# Patient Record
Sex: Male | Born: 1960
Health system: Southern US, Community
[De-identification: ages and names within clinical notes are randomized; demographics above are authoritative.]

## PROBLEM LIST (undated history)

## (undated) DIAGNOSIS — R519 Headache, unspecified: Secondary | ICD-10-CM

## (undated) DIAGNOSIS — K635 Polyp of colon: Secondary | ICD-10-CM

## (undated) DIAGNOSIS — L409 Psoriasis, unspecified: Secondary | ICD-10-CM

## (undated) DIAGNOSIS — R51 Headache: Secondary | ICD-10-CM

## (undated) DIAGNOSIS — T7840XA Allergy, unspecified, initial encounter: Secondary | ICD-10-CM

## (undated) HISTORY — DX: Allergy, unspecified, initial encounter: T78.40XA

## (undated) HISTORY — DX: Polyp of colon: K63.5

## (undated) HISTORY — PX: COLONOSCOPY: SHX174

## (undated) HISTORY — DX: Psoriasis, unspecified: L40.9

---

## 2003-10-26 ENCOUNTER — Other Ambulatory Visit: Payer: Self-pay

## 2005-07-14 ENCOUNTER — Ambulatory Visit: Payer: Self-pay | Admitting: Internal Medicine

## 2006-02-10 ENCOUNTER — Ambulatory Visit: Payer: Self-pay | Admitting: Internal Medicine

## 2006-09-11 ENCOUNTER — Ambulatory Visit: Payer: Self-pay | Admitting: Gastroenterology

## 2012-09-22 ENCOUNTER — Ambulatory Visit: Payer: Self-pay | Admitting: Medical

## 2015-02-13 ENCOUNTER — Ambulatory Visit: Payer: Self-pay | Admitting: Family Medicine

## 2015-09-19 ENCOUNTER — Ambulatory Visit: Payer: Self-pay

## 2015-11-09 IMAGING — CT CT ABD-PELV W/ CM
2 of 5 series · 16 of 46 positions shown, 18 images · IV contrast (omnipaque)
Comparison: None.

CLINICAL DATA: 53-year-old male with right flank pain for 6 days.
No bowel movement in 2 days. No known injury. Initial encounter.

EXAM:
CT ABDOMEN AND PELVIS WITH CONTRAST
TECHNIQUE: Multidetector CT imaging of the abdomen and pelvis was performed
using the standard protocol following bolus administration of
intravenous contrast.
CONTRAST:  100 mL Omnipaque 350.

[Series 2: routine with · axial · 0.75mm/px · z∈[-1118,-693]mm · 13 of 97 slices shown, 15 images]
[im 6/97  soft-tissue]
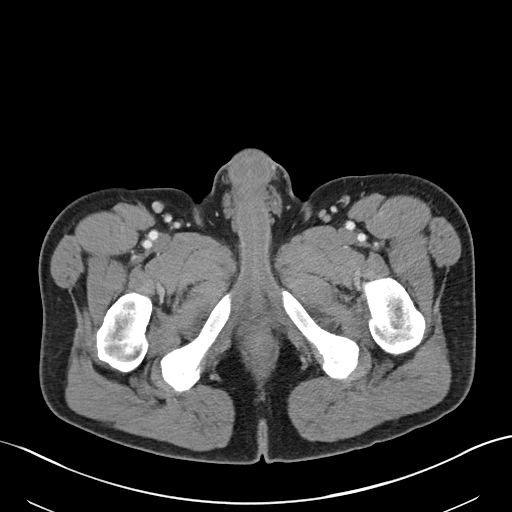
[im 6/97  bone]
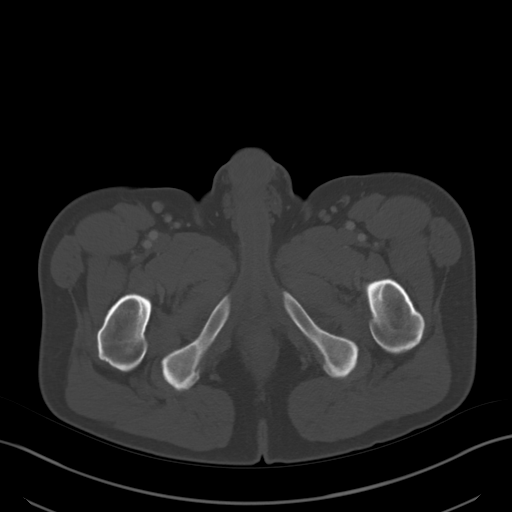
[im 11/97  soft-tissue]
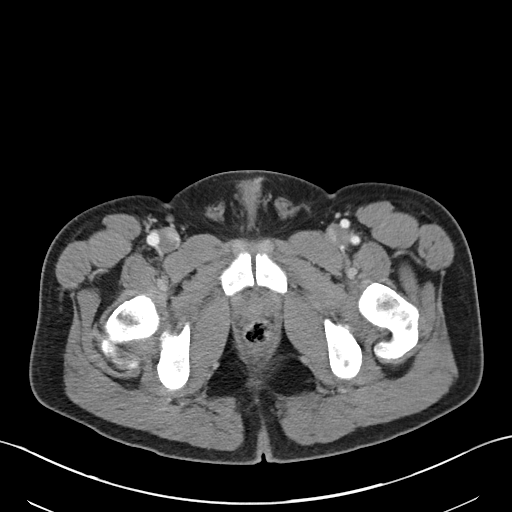
[im 22/97  soft-tissue]
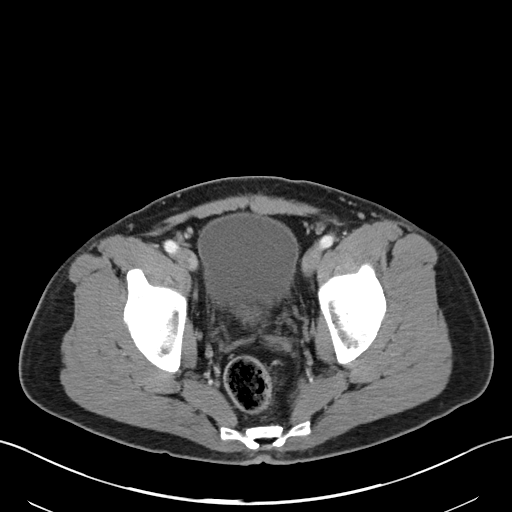
[im 27/97  soft-tissue]
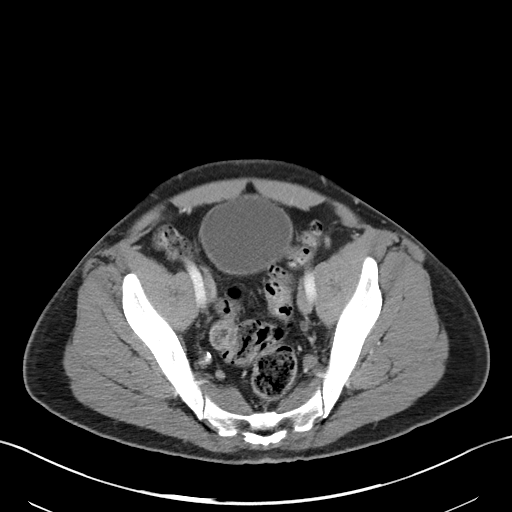
[im 33/97  soft-tissue]
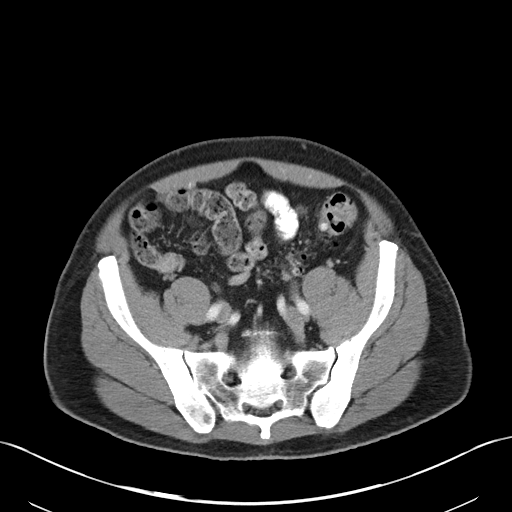
[im 43/97  soft-tissue]
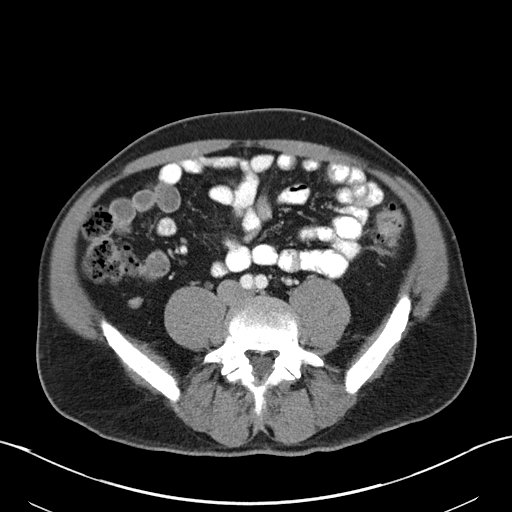
[im 49/97  soft-tissue]
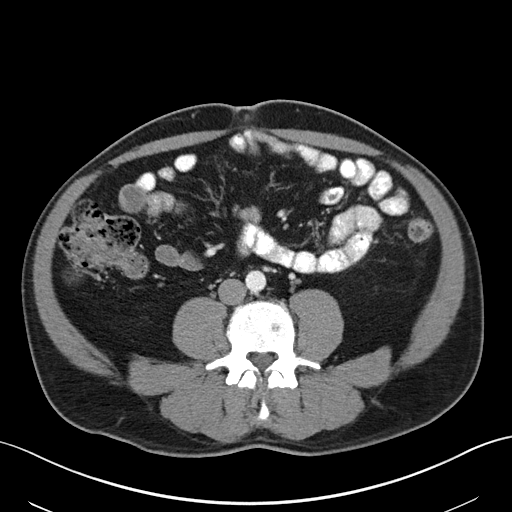
[im 54/97  soft-tissue]
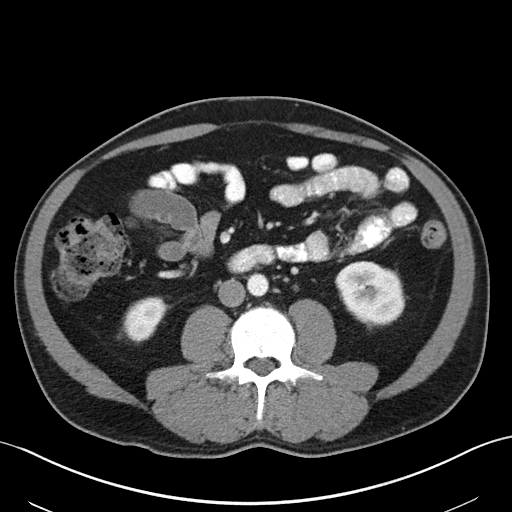
[im 65/97  soft-tissue]
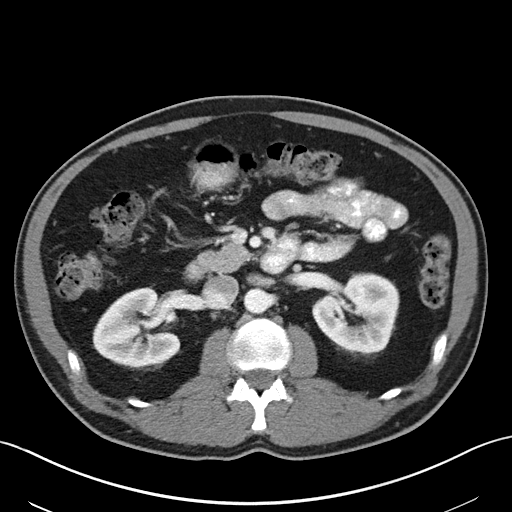
[im 65/97  bone]
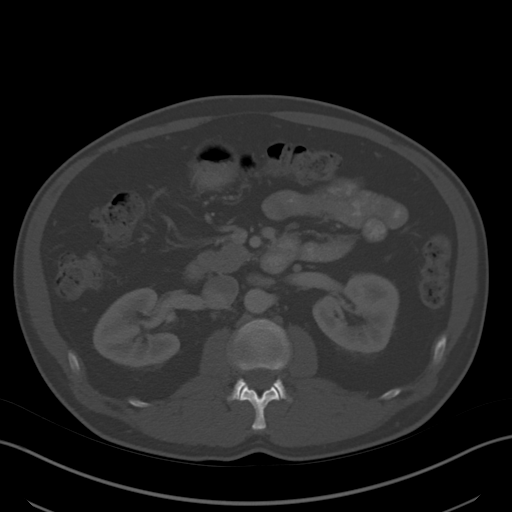
[im 70/97  soft-tissue]
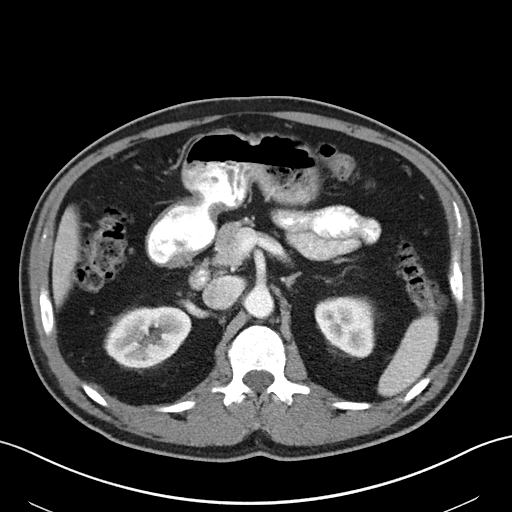
[im 75/97  soft-tissue]
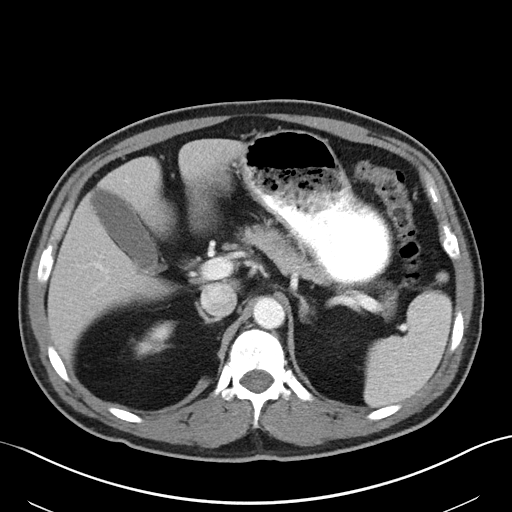
[im 86/97  soft-tissue]
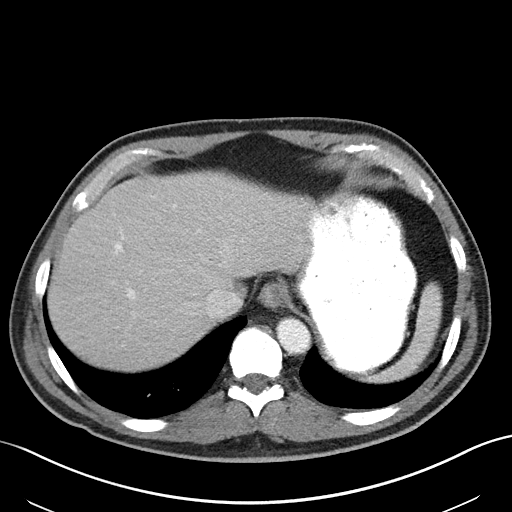
[im 91/97  soft-tissue]
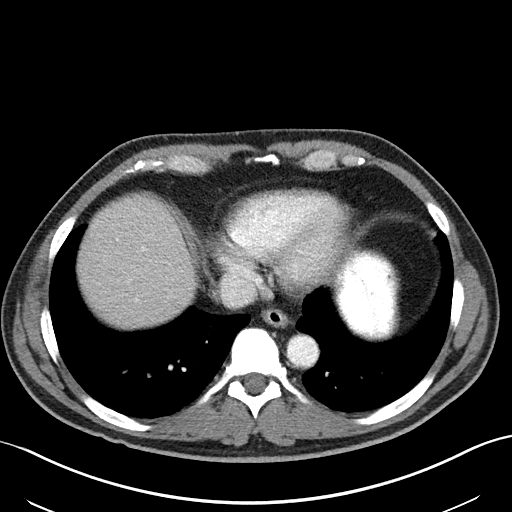

[Series 6: cor routine with · coronal · 0.75mm/px · 3 of 145 slices shown]
[im 49/145  soft-tissue]
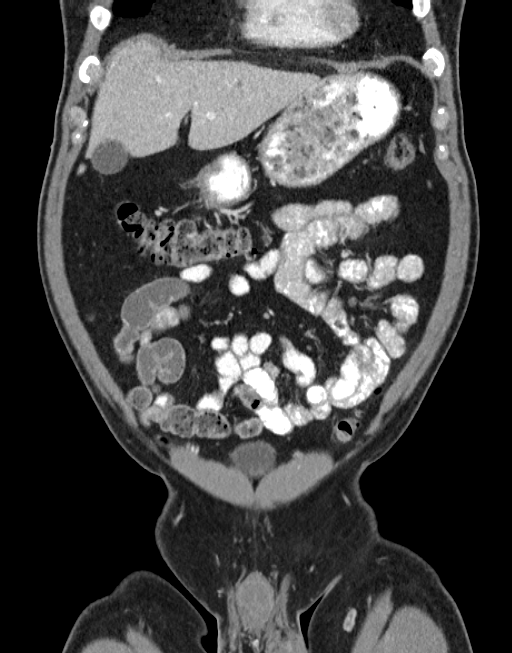
[im 65/145  soft-tissue]
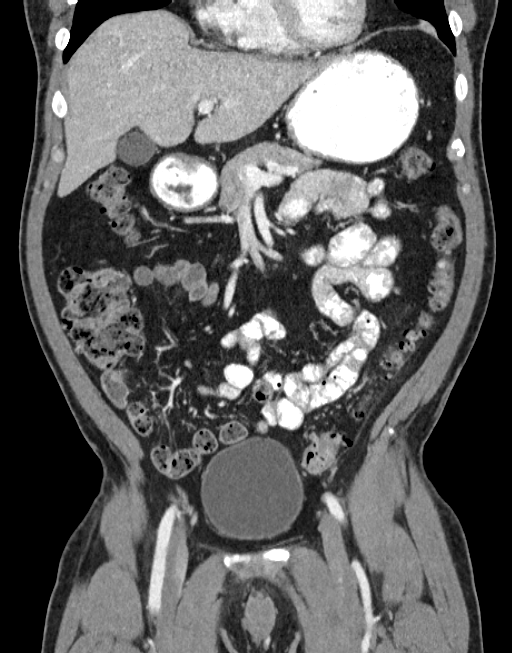
[im 81/145  soft-tissue]
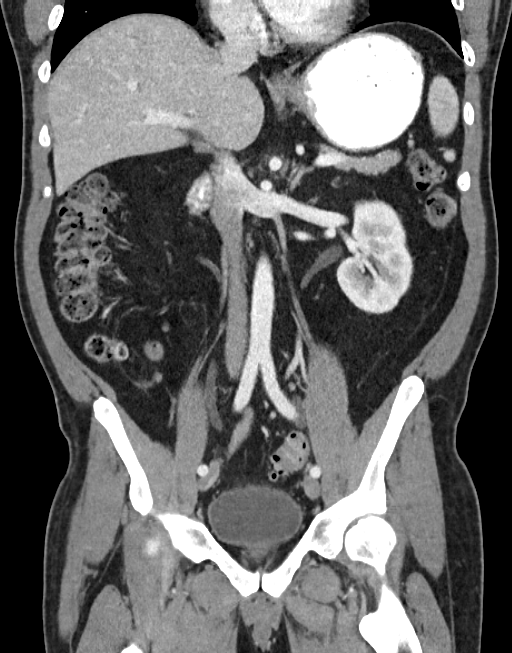

[16 of 46 positions shown; findings below may reference images not displayed]

FINDINGS: Negative lung bases.  No pericardial or pleural effusion.

Chronic L5 pars fractures. Trace L5-S1 anterolisthesis. No acute
osseous abnormality identified.

No pelvic free fluid. Retained stool in the distal colon which
otherwise appears normal. Unremarkable bladder.

Sigmoid diverticulosis. Left colon diverticulosis. There is an area
of active mesenteric inflammation at the distal descending colon,
with stranding and perhaps trace fluid in the left gutter (series 2,
image 55, sagittal image 149). No extraluminal gas. No fluid
collection.

Proximal to this diverticulosis continues but no active inflammation
is identified. The transverse colon has a more normal appearance.
There is diverticulosis in the right colon. Normal appendix. No
dilated or inflamed small bowel. There is loculated material in the
distal small bowel. Negative stomach and duodenum.

Liver, gallbladder, spleen, pancreas and adrenal glands are within
normal limits. Portal venous system within normal limits. Arterial
structures are patent. Mild Aortoiliac calcified atherosclerosis
noted. No abdominal free fluid. No lymphadenopathy. Renal
enhancement and contrast excretion is symmetric and within normal
limits.
IMPRESSION: 1. Diverticulosis of the colon. 5-6 cm segment of inflammation
affecting the distal descending colon compatible with acute
diverticulitis. No perforation or abscess.
2. Otherwise negative abdomen and pelvis.
3. Chronic L5 spondylolysis and L5-S1 grade 1 spondylolisthesis.

## 2017-01-17 ENCOUNTER — Encounter: Payer: Self-pay | Admitting: Family Medicine

## 2017-01-17 ENCOUNTER — Ambulatory Visit (INDEPENDENT_AMBULATORY_CARE_PROVIDER_SITE_OTHER): Payer: BLUE CROSS/BLUE SHIELD | Admitting: Family Medicine

## 2017-01-17 VITALS — BP 110/80 | HR 78 | Temp 98.3°F | Resp 16 | Ht 71.0 in | Wt 190.0 lb

## 2017-01-17 DIAGNOSIS — Z125 Encounter for screening for malignant neoplasm of prostate: Secondary | ICD-10-CM

## 2017-01-17 DIAGNOSIS — L409 Psoriasis, unspecified: Secondary | ICD-10-CM | POA: Diagnosis not present

## 2017-01-17 DIAGNOSIS — Z0000 Encounter for general adult medical examination without abnormal findings: Secondary | ICD-10-CM | POA: Diagnosis not present

## 2017-01-17 DIAGNOSIS — Z1211 Encounter for screening for malignant neoplasm of colon: Secondary | ICD-10-CM | POA: Diagnosis not present

## 2017-01-17 DIAGNOSIS — K5732 Diverticulitis of large intestine without perforation or abscess without bleeding: Secondary | ICD-10-CM | POA: Insufficient documentation

## 2017-01-17 DIAGNOSIS — R109 Unspecified abdominal pain: Secondary | ICD-10-CM | POA: Insufficient documentation

## 2017-01-17 DIAGNOSIS — J309 Allergic rhinitis, unspecified: Secondary | ICD-10-CM | POA: Insufficient documentation

## 2017-01-17 DIAGNOSIS — Z8614 Personal history of Methicillin resistant Staphylococcus aureus infection: Secondary | ICD-10-CM | POA: Insufficient documentation

## 2017-01-17 LAB — HEMOCCULT GUIAC POC 1CARD (OFFICE): Fecal Occult Blood, POC: NEGATIVE

## 2017-01-17 NOTE — Patient Instructions (Signed)

## 2017-01-17 NOTE — Progress Notes (Signed)
Patient: Jack Holland, Male    DOB: Aug 24, 1961, 56 y.o.   MRN: [id number] Visit Date: 01/17/2017  Today's Provider: Dortha Kern, PA   Chief Complaint  Patient presents with  . Annual Exam   Subjective:    Annual physical exam Jack Holland is a 56 y.o. male who presents today for health maintenance and complete physical. He feels well. He reports exercising none. He reports he is sleeping well. 01/08/14 CPE 01/08/14 EKG 09/11/06 Colonoscopy-normal 11/03/11 Tdap  -----------------------------------------------------------------   Review of Systems  Constitutional: Negative.   HENT: Negative.   Eyes: Negative.   Respiratory: Negative.   Gastrointestinal: Negative.   Endocrine: Negative.   Genitourinary: Negative.   Musculoskeletal: Negative.   Skin: Negative.   Allergic/Immunologic: Negative.   Neurological: Negative.   Hematological: Negative.   Psychiatric/Behavioral: Negative.     Social History      He  reports that he quit smoking about 18 years ago. He quit smokeless tobacco use about 2 weeks ago. His smokeless tobacco use included Chew. He reports that he does not drink alcohol or use drugs.       Social History   Social History  . Marital status: Married    Spouse name: N/A  . Number of children: 2  . Years of education: N/A   Social History Main Topics  . Smoking status: Former Smoker    Quit date: 12/19/1998  . Smokeless tobacco: Former Neurosurgeon    Types: Chew    Quit date: 01/03/2017  . Alcohol use No  . Drug use: No  . Sexual activity: Not Asked   Other Topics Concern  . None   Social History Narrative  . None    History reviewed. No pertinent past medical history.   Patient Active Problem List   Diagnosis Date Noted  . Abdominal pain 01/17/2017  . Allergic rhinitis 01/17/2017  . Diverticulitis of colon 01/17/2017  . History of methicillin resistant Staphylococcus aureus infection 01/17/2017    History reviewed.  No pertinent surgical history.  Family History        Family Status  Relation Status  . Mother Alive  . Father Alive  . Brother Alive  . Daughter Alive  . Son Alive        His family history includes Diabetes in his mother; Gout in his father; Hypertension in his father.     Allergies  Allergen Reactions  . Naproxen Hives    No current outpatient prescriptions on file.   Patient Care Team: Tamsen Roers, PA as PCP - General (Family Medicine)      Objective:   Vitals: BP 110/80 (BP Location: Right Arm, Patient Position: Sitting, Cuff Size: Large)   Pulse 78   Temp 98.3 F (36.8 C) (Oral)   Resp 16   Ht 5\' 11"  (1.803 m)   Wt 190 lb (86.2 kg)   SpO2 98%   BMI 26.50 kg/m    Physical Exam  Constitutional: He is oriented to person, place, and time. He appears well-developed and well-nourished.  HENT:  Head: Normocephalic and atraumatic.  Right Ear: External ear normal.  Left Ear: External ear normal.  Nose: Nose normal.  Mouth/Throat: Oropharynx is clear and moist.  Eyes: Conjunctivae and EOM are normal. Pupils are equal, round, and reactive to light. Right eye exhibits no discharge.  Neck: Normal range of motion. Neck supple. No tracheal deviation present. No thyromegaly present.  Cardiovascular: Normal rate, regular rhythm, normal  heart sounds and intact distal pulses.   No murmur heard. Pulmonary/Chest: Effort normal and breath sounds normal. No respiratory distress. He has no wheezes. He has no rales. He exhibits no tenderness.  Abdominal: Soft. Bowel sounds are normal. He exhibits no distension and no mass. There is no tenderness. There is no rebound and no guarding.  Very small umbilical hernia without discomfort - superior aspect - approximately 1 cm.  Genitourinary: Rectum normal, prostate normal and penis normal. Rectal exam shows guaiac negative stool.  Musculoskeletal: Normal range of motion. He exhibits no edema or tenderness.  Lymphadenopathy:    He  has no cervical adenopathy.  Neurological: He is alert and oriented to person, place, and time. He has normal reflexes. No cranial nerve deficit. He exhibits normal muscle tone. Coordination normal.  Skin: Skin is warm and dry. Rash noted. No erythema.  Patches of psoriasis both buttock, both posterior thighs, both inner forearms - each lesion approximately 3 cm in diameter and red with some scale.  Psychiatric: He has a normal mood and affect. His behavior is normal. Judgment and thought content normal.   Depression Screen PHQ 2/9 Scores 01/17/2017  PHQ - 2 Score 0    Assessment & Plan:     Routine Health Maintenance and Physical Exam  Exercise Activities and Dietary recommendations Goals    Encouraged slowly build to exercise for 30 minutes 4-5 days a week.      Immunization History  Administered Date(s) Administered  . Tdap 11/03/2011    Health Maintenance  Topic Date Due  . Hepatitis C Screening  September 25, 1961  . HIV Screening  07/04/1977  . TETANUS/TDAP  07/04/1981  . COLONOSCOPY  07/04/2012  . INFLUENZA VACCINE  07/19/2016     Discussed health benefits of physical activity, and encouraged him to engage in regular exercise appropriate for his age and condition.    --------------------------------------------------------------------  1. Annual physical exam General health good. Immunizations up to date. Will work on regular exercise and nutritious diet. Will get routine labs and follow up pending reports. Given anticipatory counseling. - CBC with Differential/Platelet - Comprehensive metabolic panel - Lipid panel - TSH  2. Screening for colon cancer History of mild bouts of diverticulitis over the past year. No fever, diarrhea, bloody stools or nausea. Will schedule follow up colonoscopy screening. OC-Light test negative in the office today. - Ambulatory referral to Gastroenterology  3. Psoriasis Some increase in lesions. Has 3 cm lesions on both buttocks, both  inner forearms and some on legs. May continue use of [employer] and may add Hydrocortisone prn. May need referral to dermatologist if no better. - CBC with Differential/Platelet  4. Screening PSA (prostate specific antigen) Denies dysuria, decreased urine stream, blood in urine or nocturia. No significant family history of prostate disease/cancer. Check PSA. - PSA   Dortha Kernennis Kenniya Westrich, PA  [facility]

## 2017-01-20 ENCOUNTER — Encounter: Payer: Self-pay | Admitting: Physician Assistant

## 2017-01-20 ENCOUNTER — Ambulatory Visit (INDEPENDENT_AMBULATORY_CARE_PROVIDER_SITE_OTHER): Payer: BLUE CROSS/BLUE SHIELD | Admitting: Physician Assistant

## 2017-01-20 VITALS — BP 138/82 | HR 100 | Temp 100.1°F | Resp 16 | Wt 191.0 lb

## 2017-01-20 DIAGNOSIS — R05 Cough: Secondary | ICD-10-CM | POA: Diagnosis not present

## 2017-01-20 DIAGNOSIS — R509 Fever, unspecified: Secondary | ICD-10-CM | POA: Diagnosis not present

## 2017-01-20 DIAGNOSIS — J101 Influenza due to other identified influenza virus with other respiratory manifestations: Secondary | ICD-10-CM | POA: Diagnosis not present

## 2017-01-20 DIAGNOSIS — R059 Cough, unspecified: Secondary | ICD-10-CM

## 2017-01-20 LAB — POCT INFLUENZA A/B
Influenza A, POC: POSITIVE — AB
Influenza B, POC: NEGATIVE

## 2017-01-20 MED ORDER — OSELTAMIVIR PHOSPHATE 75 MG PO CAPS: 75.0000 mg | ORAL_CAPSULE | Freq: Two times a day (BID) | ORAL | 0 refills | Status: AC

## 2017-01-20 MED ORDER — HYDROCODONE-HOMATROPINE 5-1.5 MG/5ML PO SYRP: 5.0000 mL | ORAL_SOLUTION | Freq: Every evening | ORAL | 0 refills | Status: DC | PRN

## 2017-01-20 NOTE — Patient Instructions (Signed)

## 2017-01-20 NOTE — Progress Notes (Signed)
Patient: Jack Holland Male    DOB: 12/16/1961   56 y.o.   MRN: [id number] Visit Date: 01/20/2017  Today's Provider: Trey Sailors, PA-C   Chief Complaint  Patient presents with  . URI   Subjective:    URI   This is a new problem. The current episode started in the past 7 days. The problem has been gradually worsening. The maximum temperature recorded prior to his arrival was 100.4 - 100.9 F. Associated symptoms include congestion, coughing, headaches, nausea, a sore throat and wheezing. Pertinent negatives include no abdominal pain, chest pain, diarrhea, ear pain, plugged ear sensation, rhinorrhea, sinus pain, sneezing, swollen glands or vomiting. He has tried acetaminophen and NSAIDs for the symptoms. The treatment provided no relief.       Allergies  Allergen Reactions  . Naproxen Hives    No current outpatient prescriptions on file.  Review of Systems  Constitutional: Positive for chills, diaphoresis, fatigue and fever. Negative for activity change, appetite change and unexpected weight change.  HENT: Positive for congestion and sore throat. Negative for ear pain, nosebleeds, postnasal drip, rhinorrhea, sinus pain, sinus pressure and sneezing.   Respiratory: Positive for cough, chest tightness, shortness of breath and wheezing. Negative for apnea, choking and stridor.   Cardiovascular: Negative for chest pain.  Gastrointestinal: Positive for nausea. Negative for abdominal distention, abdominal pain, anal bleeding, blood in stool, constipation, diarrhea and vomiting.  Musculoskeletal: Positive for myalgias and neck stiffness.  Neurological: Positive for light-headedness and headaches. Negative for dizziness.    Social History  Substance Use Topics  . Smoking status: Former Smoker    Quit date: 12/19/1998  . Smokeless tobacco: Former Neurosurgeon    Types: Chew    Quit date: 01/03/2017  . Alcohol use No   Objective:   BP 138/82 (BP Location: Right Arm, Patient  Position: Sitting, Cuff Size: Normal)   Pulse 100   Temp 100.1 F (37.8 C) (Oral)   Resp 16   Wt 191 lb (86.6 kg)   BMI 26.64 kg/m   Physical Exam  Constitutional: He is oriented to person, place, and time. He appears well-developed and well-nourished. He appears ill. No distress.  HENT:  Right Ear: Tympanic membrane and external ear normal.  Left Ear: Tympanic membrane and external ear normal.  Nose: Rhinorrhea present. Right sinus exhibits no maxillary sinus tenderness and no frontal sinus tenderness. Left sinus exhibits no maxillary sinus tenderness and no frontal sinus tenderness.  Mouth/Throat: Uvula is midline, oropharynx is clear and moist and mucous membranes are normal. No oropharyngeal exudate.  Clear rhinorrhea  Eyes: Conjunctivae are normal. Right eye exhibits discharge. Left eye exhibits discharge.  Watery Discharge   Neck: Normal range of motion. Neck supple.  Cardiovascular: Normal rate and regular rhythm.   Pulmonary/Chest: Effort normal and breath sounds normal. No respiratory distress. He has no wheezes. He has no rales.  Lymphadenopathy:    He has no cervical adenopathy.  Neurological: He is alert and oriented to person, place, and time.  Skin: Skin is warm and dry. He is not diaphoretic.  Psychiatric: He has a normal mood and affect. His behavior is normal.        Assessment & Plan:     1. Influenza A  - oseltamivir (TAMIFLU) 75 MG capsule; Take 1 capsule (75 mg total) by mouth 2 (two) times daily.  Dispense: 10 capsule; Refill: 0  2. Cough  - POCT Influenza A/B - HYDROcodone-homatropine (HYCODAN) 5-1.5  MG/5ML syrup; Take 5 mLs by mouth at bedtime as needed for cough.  Dispense: 120 mL; Refill: 0  3. Fever, unspecified fever cause  - POCT Influenza A/B  Return if symptoms worsen or fail to improve.  The entirety of the information documented in the History of Present Illness, Review of Systems and Physical Exam were personally obtained by me.  Portions of this information were initially documented by Kavin LeechLaura Walsh, CMA and reviewed by me for thoroughness and accuracy.    Patient Instructions  Influenza, Adult Influenza ("the flu") is an infection in the lungs, nose, and throat (respiratory tract). It is caused by a virus. The flu causes many common cold symptoms, as well as a high fever and body aches. It can make you feel very sick. The flu spreads easily from person to person (is contagious). Getting a flu shot (influenza vaccination) every year is the best way to prevent the flu. Follow these instructions at home:  Take over-the-counter and prescription medicines only as told by your doctor.  Use a cool mist humidifier to add moisture (humidity) to the air in your home. This can make it easier to breathe.  Rest as needed.  Drink enough fluid to keep your pee (urine) clear or pale yellow.  Cover your mouth and nose when you cough or sneeze.  Wash your hands with soap and water often, especially after you cough or sneeze. If you cannot use soap and water, use hand sanitizer.  Stay home from work or school as told by your doctor. Unless you are visiting your doctor, try to avoid leaving home until your fever has been gone for 24 hours without the use of medicine.  Keep all follow-up visits as told by your doctor. This is important. How is this prevented?  Getting a yearly (annual) flu shot is the best way to avoid getting the flu. You may get the flu shot in late summer, fall, or winter. Ask your doctor when you should get your flu shot.  Wash your hands often or use hand sanitizer often.  Avoid contact with people who are sick during cold and flu season.  Eat healthy foods.  Drink plenty of fluids.  Get enough sleep.  Exercise regularly. Contact a doctor if:  You get new symptoms.  You have:  Chest pain.  Watery poop (diarrhea).  A fever.  Your cough gets worse.  You start to have more mucus.  You feel  sick to your stomach (nauseous).  You throw up (vomit). Get help right away if:  You start to be short of breath or have trouble breathing.  Your skin or nails turn a bluish color.  You have very bad pain or stiffness in your neck.  You get a sudden headache.  You get sudden pain in your face or ear.  You cannot stop throwing up. This information is not intended to replace advice given to you by your health care provider. Make sure you discuss any questions you have with your health care provider. Document Released: 09/13/2008 Document Revised: 05/12/2016 Document Reviewed: 09/29/2015 Elsevier Interactive Patient Education  2017 [employer].         Trey SailorsAdriana M Pollak, PA-C  [facility]

## 2017-12-14 ENCOUNTER — Ambulatory Visit (INDEPENDENT_AMBULATORY_CARE_PROVIDER_SITE_OTHER): Payer: BLUE CROSS/BLUE SHIELD | Admitting: Physician Assistant

## 2017-12-14 DIAGNOSIS — Z23 Encounter for immunization: Secondary | ICD-10-CM | POA: Diagnosis not present

## 2017-12-14 NOTE — Progress Notes (Signed)
Flu vaccine given today without complication. Patient sat upright for 15 minutes to check for adverse reaction before being released. °

## 2018-02-22 DIAGNOSIS — L578 Other skin changes due to chronic exposure to nonionizing radiation: Secondary | ICD-10-CM | POA: Diagnosis not present

## 2018-02-22 DIAGNOSIS — D229 Melanocytic nevi, unspecified: Secondary | ICD-10-CM | POA: Diagnosis not present

## 2018-02-22 DIAGNOSIS — L821 Other seborrheic keratosis: Secondary | ICD-10-CM | POA: Diagnosis not present

## 2018-02-22 DIAGNOSIS — L409 Psoriasis, unspecified: Secondary | ICD-10-CM | POA: Diagnosis not present

## 2018-03-08 DIAGNOSIS — L419 Parapsoriasis, unspecified: Secondary | ICD-10-CM | POA: Diagnosis not present

## 2018-03-08 DIAGNOSIS — L408 Other psoriasis: Secondary | ICD-10-CM | POA: Diagnosis not present

## 2018-03-08 DIAGNOSIS — D485 Neoplasm of uncertain behavior of skin: Secondary | ICD-10-CM | POA: Diagnosis not present

## 2018-03-08 DIAGNOSIS — L308 Other specified dermatitis: Secondary | ICD-10-CM | POA: Diagnosis not present

## 2019-02-28 ENCOUNTER — Ambulatory Visit (INDEPENDENT_AMBULATORY_CARE_PROVIDER_SITE_OTHER): Payer: BLUE CROSS/BLUE SHIELD | Admitting: Physician Assistant

## 2019-02-28 ENCOUNTER — Encounter: Payer: Self-pay | Admitting: Anesthesiology

## 2019-02-28 ENCOUNTER — Telehealth: Payer: Self-pay

## 2019-02-28 ENCOUNTER — Other Ambulatory Visit: Payer: Self-pay

## 2019-02-28 ENCOUNTER — Encounter: Payer: Self-pay | Admitting: *Deleted

## 2019-02-28 ENCOUNTER — Encounter: Payer: Self-pay | Admitting: Physician Assistant

## 2019-02-28 VITALS — BP 126/84 | HR 68 | Temp 98.4°F | Resp 16 | Wt 187.2 lb

## 2019-02-28 DIAGNOSIS — Z1211 Encounter for screening for malignant neoplasm of colon: Secondary | ICD-10-CM

## 2019-02-28 DIAGNOSIS — K429 Umbilical hernia without obstruction or gangrene: Secondary | ICD-10-CM | POA: Diagnosis not present

## 2019-02-28 DIAGNOSIS — M722 Plantar fascial fibromatosis: Secondary | ICD-10-CM

## 2019-02-28 DIAGNOSIS — R29818 Other symptoms and signs involving the nervous system: Secondary | ICD-10-CM | POA: Diagnosis not present

## 2019-02-28 MED ORDER — MELOXICAM 7.5 MG PO TABS: ORAL_TABLET | ORAL | 0 refills | Status: DC

## 2019-02-28 NOTE — Patient Instructions (Signed)
Plantar Fasciitis      Plantar fasciitis is a painful foot condition that affects the heel. It occurs when the band of tissue that connects the toes to the heel bone (plantar fascia) becomes irritated. This can happen as the result of exercising too much or doing other repetitive activities (overuse injury).  The pain from plantar fasciitis can range from mild irritation to severe pain that makes it difficult to walk or move. The pain is usually worse in the morning after sleeping, or after sitting or lying down for a while. Pain may also be worse after long periods of walking or standing.  What are the causes?  This condition may be caused by:  Standing for long periods of time.  Wearing shoes that do not have good arch support.  Doing activities that put stress on joints (high-impact activities), including running, aerobics, and ballet.  Being overweight.  An abnormal way of walking (gait).  Tight muscles in the back of your lower leg (calf).  High arches in your feet.  Starting a new athletic activity.  What are the signs or symptoms?  The main symptom of this condition is heel pain. Pain may:  Be worse with first steps after a time of rest, especially in the morning after sleeping or after you have been sitting or lying down for a while.  Be worse after long periods of standing still.  Decrease after 30-45 minutes of activity, such as gentle walking.  How is this diagnosed?  This condition may be diagnosed based on your medical history and your symptoms. Your health care provider may ask questions about your activity level. Your health care provider will do a physical exam to check for:  A tender area on the bottom of your foot.  A high arch in your foot.  Pain when you move your foot.  Difficulty moving your foot.  You may have imaging tests to confirm the diagnosis, such as:  X-rays.  Ultrasound.  MRI.  How is this treated?  Treatment for plantar fasciitis depends on how severe your condition is.  Treatment may include:  Rest, ice, applying pressure (compression), and raising the affected foot (elevation). This may be called RICE therapy. Your health care provider may recommend RICE therapy along with over-the-counter pain medicines to manage your pain.  Exercises to stretch your calves and your plantar fascia.  A splint that holds your foot in a stretched, upward position while you sleep (night splint).  Physical therapy to relieve symptoms and prevent problems in the future.  Injections of steroid medicine (cortisone) to relieve pain and inflammation.  Stimulating your plantar fascia with electrical impulses (extracorporeal shock wave therapy). This is usually the last treatment option before surgery.  Surgery, if other treatments have not worked after 12 months.  Follow these instructions at home:      Managing pain, stiffness, and swelling  If directed, put ice on the painful area:  Put ice in a plastic bag, or use a frozen bottle of water.  Place a towel between your skin and the bag or bottle.  Roll the bottom of your foot over the bag or bottle.  Do this for 20 minutes, 2-3 times a day.  Wear athletic shoes that have air-sole or gel-sole cushions, or try wearing soft shoe inserts that are designed for plantar fasciitis.  Raise (elevate) your foot above the level of your heart while you are sitting or lying down.  Activity  Avoid activities that cause pain.   Ask your health care provider what activities are safe for you.  Do physical therapy exercises and stretches as told by your health care provider.  Try activities and forms of exercise that are easier on your joints (low-impact). Examples include swimming, water aerobics, and biking.  General instructions  Take over-the-counter and prescription medicines only as told by your health care provider.  Wear a night splint while sleeping, if told by your health care provider. Loosen the splint if your toes tingle, become numb, or turn cold and  blue.  Maintain a healthy weight, or work with your health care provider to lose weight as needed.  Keep all follow-up visits as told by your health care provider. This is important.  Contact a health care provider if you:  Have symptoms that do not go away after caring for yourself at home.  Have pain that gets worse.  Have pain that affects your ability to move or do your daily activities.  Summary  Plantar fasciitis is a painful foot condition that affects the heel. It occurs when the band of tissue that connects the toes to the heel bone (plantar fascia) becomes irritated.  The main symptom of this condition is heel pain that may be worse after exercising too much or standing still for a long time.  Treatment varies, but it usually starts with rest, ice, compression, and elevation (RICE therapy) and over-the-counter medicines to manage pain.  This information is not intended to replace advice given to you by your health care provider. Make sure you discuss any questions you have with your health care provider.  Document Released: 08/30/2001 Document Revised: 10/02/2017 Document Reviewed: 10/02/2017  Elsevier Interactive Patient Education © 2019 Elsevier Inc.

## 2019-02-28 NOTE — Progress Notes (Signed)
Patient: Jack Holland Male    DOB: [birth year]   58 y.o.   MRN: [id number] Visit Date: 02/28/2019  Today's Provider: Trey Sailors, PA-C   Chief Complaint  Patient presents with  . Toe Pain   Subjective:     HPI  Patient here today with Right toe pain/joint ongoing for two days. Reports that it hurts to walk. No history of gout.no redness or swelling or tender to touch. Patient took IBU this morning. No injury. He reports that the pain is worse in the morning. He reports it gets better throughout the day. He is on his feet for 10-12 hours with his job. He does wear insoles. He wears steel toed boots.   Patient also reports he is due for colonoscopy and requesting this referral today.   Patient also reports that he feels fatigued, can doze easily during the day.  Allergies  Allergen Reactions  . Naproxen Hives  . Erythromycin Rash     Current Outpatient Medications:  .  cetirizine (ZYRTEC) 10 MG tablet, Take 10 mg by mouth daily as needed for allergies., Disp: , Rfl:  .  meloxicam (MOBIC) 7.5 MG tablet, 1-2 pills daily for a week., Disp: 30 tablet, Rfl: 0 .  Multiple Vitamin (MULTIVITAMIN) tablet, Take 1 tablet by mouth daily., Disp: , Rfl:   Review of Systems  ROS negative except for what is listed in HPI.   Social History   Tobacco Use  . Smoking status: Former Smoker    Last attempt to quit: 12/19/1998    Years since quitting: 20.2  . Smokeless tobacco: Current User    Types: Chew  Substance Use Topics  . Alcohol use: No      Objective:   BP 126/84 (BP Location: Left Arm, Patient Position: Sitting, Cuff Size: Large)   Pulse 68   Temp 98.4 F (36.9 C) (Oral)   Resp 16   Wt 187 lb 3.2 oz (84.9 kg)   BMI 26.11 kg/m  Vitals:   02/28/19 1100  BP: 126/84  Pulse: 68  Resp: 16  Temp: 98.4 F (36.9 C)  TempSrc: Oral  Weight: 187 lb 3.2 oz (84.9 kg)     Physical Exam Constitutional:      Appearance: Normal appearance.  Cardiovascular:    Rate and Rhythm: Normal rate and regular rhythm.     Pulses: Normal pulses.     Heart sounds: Normal heart sounds.  Pulmonary:     Effort: Pulmonary effort is normal.     Breath sounds: Normal breath sounds.  Abdominal:     Palpations: There is mass.     Hernia: A hernia is present. Hernia is present in the umbilical area.  Musculoskeletal:     Right ankle: Normal.     Right foot: Normal.  Skin:    General: Skin is warm and dry.  Neurological:     Mental Status: He is alert and oriented to person, place, and time. Mental status is at baseline.  Psychiatric:        Mood and Affect: Mood normal.        Behavior: Behavior normal.    Results of the Epworth flowsheet 02/28/2019  Sitting and reading 2  Watching TV 2  Sitting, inactive in a public place (e.g. a theatre or a meeting) 0  As a passenger in a car for an hour without a break 0  Lying down to rest in the afternoon when circumstances permit 1  Sitting and talking to someone 0  Sitting quietly after a lunch without alcohol 1  In a car, while stopped for a few minutes in traffic 0  Total score 6        Assessment & Plan    1. Plantar fasciitis of right foot  - meloxicam (MOBIC) 7.5 MG tablet; 1-2 pills daily for a week.  Dispense: 30 tablet; Refill: 0  2. Umbilical hernia without obstruction and without gangrene  Umbilical hernia noted on exam. Counseled on strangulation precautions, avoiding abdominal exercises or heavy lifting. Refer to surgery as below.   - Ambulatory referral to General Surgery  3. Suspected sleep apnea  Home sleep study ordered, epworth complete.   4. Colon cancer screening  - Ambulatory referral to Gastroenterology  The entirety of the information documented in the History of Present Illness, Review of Systems and Physical Exam were personally obtained by me. Portions of this information were initially documented by Hetty Ely, CMA and reviewed by me for thoroughness and accuracy.          Trey Sailors, PA-C  [facility]

## 2019-02-28 NOTE — Telephone Encounter (Signed)
Gastroenterology Pre-Procedure Review  Request Date: 03/08/19 Requesting Physician: Dr. Servando Snare  PATIENT REVIEW QUESTIONS: The patient responded to the following health history questions as indicated:    1. Are you having any GI issues? No 2. Do you have a personal history of Polyps? Yes over ten years ago 3. Do you have a family history of Colon Cancer or Polyps? no 4. Diabetes Mellitus? no 5. Joint replacements in the past 12 months?no 6. Major health problems in the past 3 months?no 7. Any artificial heart valves, MVP, or defibrillator?no    MEDICATIONS & ALLERGIES:    Patient reports the following regarding taking any anticoagulation/antiplatelet therapy:   Plavix, Coumadin, Eliquis, Xarelto, Lovenox, Pradaxa, Brilinta, or Effient? no Aspirin? no  Patient confirms/reports the following medications:  Current Outpatient Medications  Medication Sig Dispense Refill  . meloxicam (MOBIC) 7.5 MG tablet 1-2 pills daily for a week. 30 tablet 0   No current facility-administered medications for this visit.     Patient confirms/reports the following allergies:  Allergies  Allergen Reactions  . Naproxen Hives    No orders of the defined types were placed in this encounter.   AUTHORIZATION INFORMATION Primary Insurance: 1D#: Group #:  Secondary Insurance: 1D#: Group #:  SCHEDULE INFORMATION: Date: 03/08/19 Time: Location:[facility]

## 2019-03-04 ENCOUNTER — Ambulatory Visit: Payer: Self-pay | Admitting: General Surgery

## 2019-03-06 ENCOUNTER — Ambulatory Visit (INDEPENDENT_AMBULATORY_CARE_PROVIDER_SITE_OTHER): Payer: BLUE CROSS/BLUE SHIELD | Admitting: General Surgery

## 2019-03-06 ENCOUNTER — Ambulatory Visit: Payer: Self-pay

## 2019-03-06 ENCOUNTER — Telehealth: Payer: Self-pay | Admitting: Family Medicine

## 2019-03-06 ENCOUNTER — Encounter: Payer: Self-pay | Admitting: *Deleted

## 2019-03-06 ENCOUNTER — Encounter: Payer: Self-pay | Admitting: General Surgery

## 2019-03-06 ENCOUNTER — Other Ambulatory Visit: Payer: Self-pay

## 2019-03-06 VITALS — BP 139/96 | HR 80 | Resp 16 | Ht 71.0 in | Wt 188.0 lb

## 2019-03-06 DIAGNOSIS — K429 Umbilical hernia without obstruction or gangrene: Secondary | ICD-10-CM | POA: Diagnosis not present

## 2019-03-06 DIAGNOSIS — E041 Nontoxic single thyroid nodule: Secondary | ICD-10-CM

## 2019-03-06 NOTE — Progress Notes (Signed)
Patient needs to be scheduled for elective umbilical hernia repair.   The patient was provided with a copy of phone interview paperwork.   Patient to be contacted once COVID-19 restrictions are lifted at [facility] to arrange a date for surgery. The patient verbalizes understanding.

## 2019-03-06 NOTE — Progress Notes (Signed)
Patient ID: Jack Holland, male   DOB: Aug 12, 1961, 58 y.o.   MRN: [id number]  Chief Complaint  Patient presents with  . New Patient (Initial Visit)    new patient Umbilical hernia without obstruction and without gangrene    HPI Jack Holland is a 58 y.o. male.   He has been referred today for surgical evaluation of an umbilical hernia.  Mr. Cieslik states that it has been present for little over a year.  It occasionally bothers him, but not to any great extent.  He says that he "knows it is there" when he coughs or strains or otherwise performs a Valsalva-type maneuver.  He denies any nausea or vomiting.  He has not had any issues with moving his bowels.  The area has never become red, hot, or tight.  He says that it has never really "poked out".  He has never had any prior surgeries.  He does admit to chewing tobacco use, but does not smoke.  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   Past Medical History:  Diagnosis Date  . Allergy   . Colon polyp   . Headache    sinus  . Psoriasis     Past Surgical History:  Procedure Laterality Date  . COLONOSCOPY  2010?    Family History  Problem Relation Age of Onset  . Diabetes Mother   . Gout Father   . Hypertension Father     Social History Social History   Tobacco Use  . Smoking status: Former Smoker    Last attempt to quit: 12/19/1998    Years since quitting: 20.2  . Smokeless tobacco: Current User    Types: Chew  Substance Use Topics  . Alcohol use: No  . Drug use: No    Allergies  Allergen Reactions  . Erythromycin Rash  . Naproxen Hives    Current Outpatient Medications  Medication Sig Dispense Refill  . cetirizine (ZYRTEC) 10 MG tablet Take 10 mg by mouth daily as needed for allergies.    . clobetasol ointment (TEMOVATE) 0.05 % Apply 2 times a day until improved and then as needed    . Multiple Vitamin (MULTIVITAMIN) tablet Take 1 tablet by mouth daily.      No current facility-administered medications for this visit.     Review of Systems Review of Systems  All other systems reviewed and are negative.   Blood pressure (!) 139/96, pulse 80, resp. rate 16, height 5\' 11"  (1.803 m), weight 188 lb (85.3 kg), SpO2 97 %.  Physical Exam Physical Exam Vitals signs reviewed.  Constitutional:      General: He is not in acute distress.    Appearance: Normal appearance. He is normal weight.  HENT:     Head: Normocephalic and atraumatic.     Nose: No congestion.     Mouth/Throat:     Pharynx: No oropharyngeal exudate or posterior oropharyngeal erythema.  Eyes:     General: No scleral icterus.       Right eye: No discharge.        Left eye: No discharge.     Conjunctiva/sclera: Conjunctivae normal.     Pupils: Pupils are equal, round, and reactive to light.  Neck:     Musculoskeletal: Normal range of motion.  Cardiovascular:     Rate and Rhythm: Normal rate and regular rhythm.     Pulses: Normal pulses.  Pulmonary:     Effort:  Pulmonary effort is normal.     Breath sounds: Normal breath sounds.  Abdominal:     General: Abdomen is flat. Bowel sounds are normal.     Palpations: Abdomen is soft.     Hernia: A hernia is present.     Comments: Tiny reducible umbilical hernia.  Diastasis rectus present.  Genitourinary:    Comments: deferred Musculoskeletal: Normal range of motion.        General: No swelling or tenderness.  Lymphadenopathy:     Cervical: No cervical adenopathy.  Skin:    General: Skin is warm and dry.  Neurological:     General: No focal deficit present.     Mental Status: He is alert and oriented to person, place, and time.  Psychiatric:        Mood and Affect: Mood normal.        Behavior: Behavior normal.        Thought Content: Thought content normal.     Data Reviewed No relevant data available for review.  Assessment This is a 58 year old man with a small, reducible umbilical hernia.  It is minimally  symptomatic at this time.  He is concerned, that with his occupation, the hernia may enlarge and he wishes to have it repaired before that can occur.  Plan Due to all elective surgeries being postponed secondary to the coronavirus pandemic, I have advised Mr. Gladin that we can repair his hernia at a future time, when we are able to proceed with elective operations.  I discussed the risks with him, including bleeding, infection, recurrence, bowel injury, and the risks of anesthesia.  As the hernia is small at this time, I do not think mesh will be required, but did let him know that there are risks associated with mesh placement, including chronic pain, seroma formation, and mesh infection.  I also discussed with him that after the operation, he will need to refrain from lifting anything heavier than 10 pounds for 6 weeks.  He will need to coordinate this with his work.  He is also at higher risk of recurrence due to nicotine use and he was advised to engage in tobacco cessation prior to his operation.  We will contact him regarding scheduling when we have a better idea of what our options will be for elective procedures in the future.      Duanne Guess 03/06/2019, 4:08 PM

## 2019-03-06 NOTE — Telephone Encounter (Signed)
Order for home sleep study faxed to [facility]

## 2019-03-06 NOTE — Patient Instructions (Addendum)
The patient is aware to call back for any questions or new concerns. Schedule surgery Recommend no lifting over 10 pounds for 6 weeks after surgery.   Umbilical Hernia, Adult  A hernia is a bulge of tissue that pushes through an opening between muscles. An umbilical hernia happens in the abdomen, near the belly button (umbilicus). The hernia may contain tissues from the small intestine, large intestine, or fatty tissue covering the intestines (omentum). Umbilical hernias in adults tend to get worse over time, and they require surgical treatment. There are several types of umbilical hernias. You may have:  A hernia located just above or below the umbilicus (indirect hernia). This is the most common type of umbilical hernia in adults.  A hernia that forms through an opening formed by the umbilicus (direct hernia).  A hernia that comes and goes (reducible hernia). A reducible hernia may be visible only when you strain, lift something heavy, or cough. This type of hernia can be pushed back into the abdomen (reduced).  A hernia that traps abdominal tissue inside the hernia (incarcerated hernia). This type of hernia cannot be reduced.  A hernia that cuts off blood flow to the tissues inside the hernia (strangulated hernia). The tissues can start to die if this happens. This type of hernia requires emergency treatment. What are the causes? An umbilical hernia happens when tissue inside the abdomen presses on a weak area of the abdominal muscles. What increases the risk? You may have a greater risk of this condition if you:  Are obese.  Have had several pregnancies.  Have a buildup of fluid inside your abdomen (ascites).  Have had surgery that weakens the abdominal muscles. What are the signs or symptoms? The main symptom of this condition is a painless bulge at or near the belly button. A reducible hernia may be visible only when you strain, lift something heavy, or cough. Other symptoms may  include:  Dull pain.  A feeling of pressure. Symptoms of a strangulated hernia may include:  Pain that gets increasingly worse.  Nausea and vomiting.  Pain when pressing on the hernia.  Skin over the hernia becoming red or purple.  Constipation.  Blood in the stool. How is this diagnosed? This condition may be diagnosed based on:  A physical exam. You may be asked to cough or strain while standing. These actions increase the pressure inside your abdomen and force the hernia through the opening in your muscles. Your health care provider may try to reduce the hernia by pressing on it.  Your symptoms and medical history. How is this treated? Surgery is the only treatment for an umbilical hernia. Surgery for a strangulated hernia is done as soon as possible. If you have a small hernia that is not incarcerated, you may need to lose weight before having surgery. Follow these instructions at home:  Lose weight, if told by your health care provider.  Do not try to push the hernia back in.  Watch your hernia for any changes in color or size. Tell your health care provider if any changes occur.  You may need to avoid activities that increase pressure on your hernia.  Do not lift anything that is heavier than 10 lb (4.5 kg) until your health care provider says that this is safe.  Take over-the-counter and prescription medicines only as told by your health care provider.  Keep all follow-up visits as told by your health care provider. This is important. Contact a health care provider  if:  Your hernia gets larger.  Your hernia becomes painful. Get help right away if:  You develop sudden, severe pain near the area of your hernia.  You have pain as well as nausea or vomiting.  You have pain and the skin over your hernia changes color.  You develop a fever. This information is not intended to replace advice given to you by your health care provider. Make sure you discuss any  questions you have with your health care provider. Document Released: 05/06/2016 Document Revised: 01/17/2018 Document Reviewed: 06/05/2017 Elsevier Interactive Patient Education  2019 [employer].

## 2019-03-08 ENCOUNTER — Ambulatory Visit
Admission: RE | Admit: 2019-03-08 | Payer: BLUE CROSS/BLUE SHIELD | Source: Home / Self Care | Admitting: Gastroenterology

## 2019-03-08 HISTORY — DX: Headache: R51

## 2019-03-08 HISTORY — DX: Headache, unspecified: R51.9

## 2019-03-08 SURGERY — COLONOSCOPY WITH PROPOFOL
Anesthesia: Choice

## 2019-03-22 DIAGNOSIS — R0602 Shortness of breath: Secondary | ICD-10-CM | POA: Diagnosis not present

## 2019-03-22 DIAGNOSIS — G4733 Obstructive sleep apnea (adult) (pediatric): Secondary | ICD-10-CM | POA: Diagnosis not present

## 2019-03-23 DIAGNOSIS — G4733 Obstructive sleep apnea (adult) (pediatric): Secondary | ICD-10-CM | POA: Diagnosis not present

## 2019-03-23 DIAGNOSIS — R0602 Shortness of breath: Secondary | ICD-10-CM | POA: Diagnosis not present

## 2019-04-26 DIAGNOSIS — G4733 Obstructive sleep apnea (adult) (pediatric): Secondary | ICD-10-CM | POA: Diagnosis not present

## 2019-04-30 ENCOUNTER — Telehealth: Payer: Self-pay | Admitting: Physician Assistant

## 2019-04-30 NOTE — Telephone Encounter (Signed)
Order for CPAP signed and placed for fax.

## 2019-05-23 ENCOUNTER — Telehealth: Payer: Self-pay | Admitting: Gastroenterology

## 2019-05-23 NOTE — Telephone Encounter (Signed)
Pt   Wife is calling to r/s pt procedure please return her call to schedule

## 2019-05-27 DIAGNOSIS — G4733 Obstructive sleep apnea (adult) (pediatric): Secondary | ICD-10-CM | POA: Diagnosis not present

## 2019-05-27 NOTE — Telephone Encounter (Signed)
Returned patients wife's call to schedule her husband's colonoscopy.  She did not have her calendar with her and said she will call back once she gets back to her desk.  Scheduling note: Screening Colon with Dr. Allen Norris  Thanks Diana Armijo

## 2019-05-29 ENCOUNTER — Telehealth: Payer: Self-pay | Admitting: Gastroenterology

## 2019-05-29 ENCOUNTER — Other Ambulatory Visit: Payer: Self-pay

## 2019-05-29 DIAGNOSIS — Z1211 Encounter for screening for malignant neoplasm of colon: Secondary | ICD-10-CM

## 2019-05-29 MED ORDER — NA SULFATE-K SULFATE-MG SULF 17.5-3.13-1.6 GM/177ML PO SOLN: 1.0000 | Freq: Once | ORAL | 0 refills | Status: AC

## 2019-05-29 NOTE — Telephone Encounter (Signed)
Pt  Wife is calling back to reschedule pt procedure she wants to check if June 29th is available

## 2019-06-13 ENCOUNTER — Encounter: Payer: Self-pay | Admitting: *Deleted

## 2019-06-13 ENCOUNTER — Other Ambulatory Visit: Payer: Self-pay

## 2019-06-13 ENCOUNTER — Other Ambulatory Visit
Admission: RE | Admit: 2019-06-13 | Discharge: 2019-06-13 | Disposition: A | Discharge status: Home / Self Care | Payer: BC Managed Care – PPO | Source: Ambulatory Visit | Attending: Gastroenterology | Admitting: Gastroenterology

## 2019-06-13 DIAGNOSIS — Z1159 Encounter for screening for other viral diseases: Secondary | ICD-10-CM | POA: Insufficient documentation

## 2019-06-13 DIAGNOSIS — Z1211 Encounter for screening for malignant neoplasm of colon: Secondary | ICD-10-CM

## 2019-06-14 LAB — NOVEL CORONAVIRUS, NAA (HOSP ORDER, SEND-OUT TO REF LAB; TAT 18-24 HRS): SARS-CoV-2, NAA: NOT DETECTED

## 2019-06-14 NOTE — Discharge Instructions (Signed)
General Anesthesia, Adult, Care After  This sheet gives you information about how to care for yourself after your procedure. Your health care provider may also give you more specific instructions. If you have problems or questions, contact your health care provider.  What can I expect after the procedure?  After the procedure, the following side effects are common:  Pain or discomfort at the IV site.  Nausea.  Vomiting.  Sore throat.  Trouble concentrating.  Feeling cold or chills.  Weak or tired.  Sleepiness and fatigue.  Soreness and body aches. These side effects can affect parts of the body that were not involved in surgery.  Follow these instructions at home:    For at least 24 hours after the procedure:  Have a responsible adult stay with you. It is important to have someone help care for you until you are awake and alert.  Rest as needed.  Do not:  Participate in activities in which you could fall or become injured.  Drive.  Use heavy machinery.  Drink alcohol.  Take sleeping pills or medicines that cause drowsiness.  Make important decisions or sign legal documents.  Take care of children on your own.  Eating and drinking  Follow any instructions from your health care provider about eating or drinking restrictions.  When you feel hungry, start by eating small amounts of foods that are soft and easy to digest (bland), such as toast. Gradually return to your regular diet.  Drink enough fluid to keep your urine pale yellow.  If you vomit, rehydrate by drinking water, juice, or clear broth.  General instructions  If you have sleep apnea, surgery and certain medicines can increase your risk for breathing problems. Follow instructions from your health care provider about wearing your sleep device:  Anytime you are sleeping, including during daytime naps.  While taking prescription pain medicines, sleeping medicines, or medicines that make you drowsy.  Return to your normal activities as told by your health care  provider. Ask your health care provider what activities are safe for you.  Take over-the-counter and prescription medicines only as told by your health care provider.  If you smoke, do not smoke without supervision.  Keep all follow-up visits as told by your health care provider. This is important.  Contact a health care provider if:  You have nausea or vomiting that does not get better with medicine.  You cannot eat or drink without vomiting.  You have pain that does not get better with medicine.  You are unable to pass urine.  You develop a skin rash.  You have a fever.  You have redness around your IV site that gets worse.  Get help right away if:  You have difficulty breathing.  You have chest pain.  You have blood in your urine or stool, or you vomit blood.  Summary  After the procedure, it is common to have a sore throat or nausea. It is also common to feel tired.  Have a responsible adult stay with you for the first 24 hours after general anesthesia. It is important to have someone help care for you until you are awake and alert.  When you feel hungry, start by eating small amounts of foods that are soft and easy to digest (bland), such as toast. Gradually return to your regular diet.  Drink enough fluid to keep your urine pale yellow.  Return to your normal activities as told by your health care provider. Ask your health care   provider what activities are safe for you.  This information is not intended to replace advice given to you by your health care provider. Make sure you discuss any questions you have with your health care provider.  Document Released: 03/13/2001 Document Revised: 07/21/2017 Document Reviewed: 07/21/2017  Elsevier Interactive Patient Education  2019 [employer] Inc[employer]

## 2019-06-17 ENCOUNTER — Ambulatory Visit: Payer: BC Managed Care – PPO | Admitting: Anesthesiology

## 2019-06-17 ENCOUNTER — Encounter
Admission: RE | Disposition: A | Discharge status: Home / Self Care | Payer: Self-pay | Source: Home / Self Care | Attending: Gastroenterology

## 2019-06-17 ENCOUNTER — Ambulatory Visit
Admission: RE | Admit: 2019-06-17 | Discharge: 2019-06-17 | Disposition: A | Discharge status: Home / Self Care | Payer: BC Managed Care – PPO | Attending: Gastroenterology | Admitting: Gastroenterology

## 2019-06-17 DIAGNOSIS — Z1211 Encounter for screening for malignant neoplasm of colon: Secondary | ICD-10-CM | POA: Diagnosis not present

## 2019-06-17 DIAGNOSIS — Z8719 Personal history of other diseases of the digestive system: Secondary | ICD-10-CM | POA: Diagnosis not present

## 2019-06-17 DIAGNOSIS — K5730 Diverticulosis of large intestine without perforation or abscess without bleeding: Secondary | ICD-10-CM | POA: Diagnosis not present

## 2019-06-17 DIAGNOSIS — K641 Second degree hemorrhoids: Secondary | ICD-10-CM | POA: Insufficient documentation

## 2019-06-17 DIAGNOSIS — Z87891 Personal history of nicotine dependence: Secondary | ICD-10-CM | POA: Insufficient documentation

## 2019-06-17 DIAGNOSIS — L409 Psoriasis, unspecified: Secondary | ICD-10-CM | POA: Insufficient documentation

## 2019-06-17 HISTORY — PX: COLONOSCOPY WITH PROPOFOL: SHX5780

## 2019-06-17 SURGERY — COLONOSCOPY WITH PROPOFOL
Anesthesia: General

## 2019-06-17 MED ORDER — PROPOFOL 10 MG/ML IV BOLUS
INTRAVENOUS | Status: DC | PRN
  Administered 2019-06-17 (×2): 40 mg via INTRAVENOUS
  Administered 2019-06-17: 150 mg via INTRAVENOUS
  Administered 2019-06-17 (×2): 40 mg via INTRAVENOUS

## 2019-06-17 MED ORDER — SODIUM CHLORIDE 0.9 % IV SOLN: INTRAVENOUS | Status: DC

## 2019-06-17 MED ORDER — LACTATED RINGERS IV SOLN
INTRAVENOUS | Status: DC
  Administered 2019-06-17: 10:00:00 via INTRAVENOUS

## 2019-06-17 MED ORDER — ONDANSETRON HCL 4 MG/2ML IJ SOLN: 4.0000 mg | Freq: Once | INTRAMUSCULAR | Status: DC | PRN

## 2019-06-17 MED ORDER — STERILE WATER FOR IRRIGATION IR SOLN
Status: DC | PRN
  Administered 2019-06-17: .3 mL

## 2019-06-17 MED ORDER — LIDOCAINE HCL (CARDIAC) PF 100 MG/5ML IV SOSY
PREFILLED_SYRINGE | INTRAVENOUS | Status: DC | PRN
  Administered 2019-06-17: 30 mg via INTRAVENOUS

## 2019-06-17 SURGICAL SUPPLY — 24 items
CANISTER SUCT 1200ML W/VALVE (MISCELLANEOUS) ×2 IMPLANT
CLIP HMST 235XBRD CATH ROT (MISCELLANEOUS) IMPLANT
CLIP RESOLUTION 360 11X235 (MISCELLANEOUS)
ELECT REM PT RETURN 9FT ADLT (ELECTROSURGICAL)
ELECTRODE REM PT RTRN 9FT ADLT (ELECTROSURGICAL) IMPLANT
FCP ESCP3.2XJMB 240X2.8X (MISCELLANEOUS)
FORCEPS BIOP RAD 4 LRG CAP 4 (CUTTING FORCEPS) IMPLANT
FORCEPS BIOP RJ4 240 W/NDL (MISCELLANEOUS)
FORCEPS ESCP3.2XJMB 240X2.8X (MISCELLANEOUS) IMPLANT
GOWN CVR UNV OPN BCK APRN NK (MISCELLANEOUS) ×2 IMPLANT
GOWN ISOL THUMB LOOP REG UNIV (MISCELLANEOUS) ×4
INJECTOR VARIJECT VIN23 (MISCELLANEOUS) IMPLANT
KIT DEFENDO VALVE AND CONN (KITS) IMPLANT
KIT ENDO PROCEDURE OLY (KITS) ×2 IMPLANT
MARKER SPOT ENDO TATTOO 5ML (MISCELLANEOUS) IMPLANT
PROBE APC STR FIRE (PROBE) IMPLANT
RETRIEVER NET ROTH 2.5X230 LF (MISCELLANEOUS) IMPLANT
SNARE SHORT THROW 13M SML OVAL (MISCELLANEOUS) IMPLANT
SNARE SHORT THROW 30M LRG OVAL (MISCELLANEOUS) IMPLANT
SNARE SNG USE RND 15MM (INSTRUMENTS) IMPLANT
SPOT EX ENDOSCOPIC TATTOO (MISCELLANEOUS)
TRAP ETRAP POLY (MISCELLANEOUS) IMPLANT
VARIJECT INJECTOR VIN23 (MISCELLANEOUS)
WATER STERILE IRR 250ML POUR (IV SOLUTION) ×2 IMPLANT

## 2019-06-17 NOTE — Anesthesia Procedure Notes (Signed)
Date/Time: 06/17/2019 9:49 AM Performed by: Cameron Ali, CRNA Pre-anesthesia Checklist: Patient identified, Emergency Drugs available, Suction available, Timeout performed and Patient being monitored Patient Re-evaluated:Patient Re-evaluated prior to induction Oxygen Delivery Method: Nasal cannula Placement Confirmation: positive ETCO2

## 2019-06-17 NOTE — Anesthesia Postprocedure Evaluation (Signed)
Anesthesia Post Note  Patient: Jack Holland  Procedure(s) Performed: COLONOSCOPY WITH PROPOFOL (N/A )  Patient location during evaluation: PACU Anesthesia Type: General Level of consciousness: awake Pain management: pain level controlled Vital Signs Assessment: post-procedure vital signs reviewed and stable Respiratory status: respiratory function stable Cardiovascular status: stable Postop Assessment: no signs of nausea or vomiting Anesthetic complications: no    Veda Canning

## 2019-06-17 NOTE — Anesthesia Preprocedure Evaluation (Signed)
Anesthesia Evaluation  Patient identified by MRN, date of birth, ID band Patient awake    Reviewed: Allergy & Precautions, NPO status , Patient's Chart, lab work & pertinent test results  Airway Mallampati: II  TM Distance: >3 FB     Dental   Pulmonary former smoker,    breath sounds clear to auscultation       Cardiovascular negative cardio ROS   Rhythm:Regular Rate:Normal     Neuro/Psych  Headaches,    GI/Hepatic Hx diverticulitis and polyps   Endo/Other    Renal/GU      Musculoskeletal   Abdominal   Peds  Hematology   Anesthesia Other Findings Psoriasis  Reproductive/Obstetrics                             Anesthesia Physical Anesthesia Plan  ASA: II  Anesthesia Plan: General   Post-op Pain Management:    Induction: Intravenous  PONV Risk Score and Plan:   Airway Management Planned: Nasal Cannula and Natural Airway  Additional Equipment:   Intra-op Plan:   Post-operative Plan:   Informed Consent: I have reviewed the patients History and Physical, chart, labs and discussed the procedure including the risks, benefits and alternatives for the proposed anesthesia with the patient or authorized representative who has indicated his/her understanding and acceptance.       Plan Discussed with: CRNA  Anesthesia Plan Comments:         Anesthesia Quick Evaluation

## 2019-06-17 NOTE — H&P (Signed)
[name], MD [facility] [address], [place], [place] [address] Phone: [phone] Fax : [phone]  Primary Care Physician:  Margo Common, [place] Primary Gastroenterologist:  Dr. Allen Norris  Pre-Procedure History & Physical: HPI:  Jack Holland is a 58 y.o. male is here for a screening colonoscopy.   Past Medical History:  Diagnosis Date  . Allergy   . Colon polyp   . Headache    sinus  . Psoriasis     Past Surgical History:  Procedure Laterality Date  . COLONOSCOPY  2010?    Prior to Admission medications   Medication Sig Start Date End Date Taking? Authorizing Provider  clobetasol ointment (TEMOVATE) 0.05 % Apply 2 times a day until improved and then as needed 03/08/18  Yes [provider]  cetirizine (ZYRTEC) 10 MG tablet Take 10 mg by mouth daily as needed for allergies.    [provider]  Multiple Vitamin (MULTIVITAMIN) tablet Take 1 tablet by mouth daily.    [provider]    Allergies as of 06/13/2019 - Review Complete 06/13/2019  Allergen Reaction Noted  . Erythromycin Rash 02/28/2019  . Naproxen Hives     Family History  Problem Relation Age of Onset  . Diabetes Mother   . Gout Father   . Hypertension Father     Social History   Socioeconomic History  . Marital status: Married    Spouse name: Not on file  . Number of children: 2  . Years of education: Not on file  . Highest education level: Not on file  Occupational History  . Not on file  Social Needs  . Financial resource strain: Not on file  . Food insecurity    Worry: Not on file    Inability: Not on file  . Transportation needs    Medical: Not on file    Non-medical: Not on file  Tobacco Use  . Smoking status: Former Smoker    Quit date: 12/19/1998    Years since quitting: 20.5  . Smokeless tobacco: Current User    Types: Chew  Substance and Sexual Activity  . Alcohol use: No  . Drug use: No  . Sexual activity: Not on file  Lifestyle  .  Physical activity    Days per week: Not on file    Minutes per session: Not on file  . Stress: Not on file  Relationships  . Social Herbalist on phone: Not on file    Gets together: Not on file    Attends religious service: Not on file    Active member of club or organization: Not on file    Attends meetings of clubs or organizations: Not on file    Relationship status: Not on file  . Intimate partner violence    Fear of current or ex partner: Not on file    Emotionally abused: Not on file    Physically abused: Not on file    Forced sexual activity: Not on file  Other Topics Concern  . Not on file  Social History Narrative  . Not on file    Review of Systems: See HPI, otherwise negative ROS  Physical Exam: BP (!) 135/99   Pulse 71   Temp (!) 97.5 F (36.4 C) (Temporal)   Ht 5\' 11"  (1.803 m)   Wt 82.1 kg   SpO2 99%   BMI 25.24 kg/m  General:   Alert,  pleasant and cooperative in NAD Head:  Normocephalic and atraumatic. Neck:  Supple; no masses or thyromegaly. Lungs:  Clear throughout to auscultation.    Heart:  Regular rate and rhythm. Abdomen:  Soft, nontender and nondistended. Normal bowel sounds, without guarding, and without rebound.   Neurologic:  Alert and  oriented x4;  grossly normal neurologically.  Impression/Plan: Jack Holland is now here to undergo a screening colonoscopy.  Risks, benefits, and alternatives regarding colonoscopy have been reviewed with the patient.  Questions have been answered.  All parties agreeable.

## 2019-06-17 NOTE — Op Note (Signed)
[facility] Gastroenterology Patient Name: Jack Holland Procedure Date: 06/17/2019 9:43 AM MRN: [id number] Account #: [id number] Date of Birth: 09-Aug-1961 Admit Type: Outpatient Age: [age] [facility] Gender: Male Note Status: Finalized Procedure:            Colonoscopy Indications:          Screening for colorectal malignant neoplasm Providers:            Midge Miniumarren Andrya Roppolo MD, MD Referring MD:         Jodell Ciproennis E. Chrismon, MD (Referring MD) Medicines:            Propofol per Anesthesia Complications:        No immediate complications. Procedure:            Pre-Anesthesia Assessment:                       - Prior to the procedure, a History and Physical was                        performed, and patient medications and allergies were                        reviewed. The patient's tolerance of previous                        anesthesia was also reviewed. The risks and benefits of                        the procedure and the sedation options and risks were                        discussed with the patient. All questions were                        answered, and informed consent was obtained. Prior                        Anticoagulants: The patient has taken no previous                        anticoagulant or antiplatelet agents. ASA Grade                        Assessment: II - A patient with mild systemic disease.                        After reviewing the risks and benefits, the patient was                        deemed in satisfactory condition to undergo the                        procedure.                       After obtaining informed consent, the colonoscope was                        passed under direct vision. Throughout the procedure,  the patient's blood pressure, pulse, and oxygen                        saturations were monitored continuously. The was                        introduced through the anus and advanced to the the                 cecum, identified by appendiceal orifice and ileocecal                        valve. The colonoscopy was performed without                        difficulty. The patient tolerated the procedure well.                        The quality of the bowel preparation was excellent. Findings:      The perianal and digital rectal examinations were normal.      Multiple small-mouthed diverticula were found in the entire colon.      Non-bleeding internal hemorrhoids were found during retroflexion. The       hemorrhoids were Grade II (internal hemorrhoids that prolapse but reduce       spontaneously). Impression:           - Diverticulosis in the entire examined colon.                       - Non-bleeding internal hemorrhoids.                       - No specimens collected. Recommendation:       - Discharge patient to home.                       - Resume previous diet.                       - Continue present medications.                       - Repeat colonoscopy in 10 years for screening unless                        any change in family history or lower GI problems. Procedure Code(s):    --- Professional ---                       [id number], Colonoscopy, flexible; diagnostic, including                        collection of specimen(s) by brushing or washing, when                        performed (separate procedure) Diagnosis Code(s):    --- Professional ---                       Z12.11, Encounter for screening for malignant neoplasm                        of colon CPT copyright 2019 American  Medical Association. All rights reserved. The codes documented in this report are preliminary and upon coder review may  be revised to meet current compliance requirements. [name] MD, MD 06/17/2019 10:07:53 AM This report has been signed electronically. Number of Addenda: 0 Note Initiated On: 06/17/2019 9:43 AM Scope Withdrawal Time: 0 hours 9 minutes 24 seconds  Total Procedure Duration: 0  hours 11 minutes 24 seconds  Estimated Blood Loss: Estimated blood loss: none.      [facility]

## 2019-06-17 NOTE — Transfer of Care (Signed)
Immediate Anesthesia Transfer of Care Note  Patient: Jack Holland  Procedure(s) Performed: COLONOSCOPY WITH PROPOFOL (N/A )  Patient Location: PACU  Anesthesia Type: General  Level of Consciousness: awake, alert  and patient cooperative  Airway and Oxygen Therapy: Patient Spontanous Breathing and Patient connected to supplemental oxygen  Post-op Assessment: Post-op Vital signs reviewed, Patient's Cardiovascular Status Stable, Respiratory Function Stable, Patent Airway and No signs of Nausea or vomiting  Post-op Vital Signs: Reviewed and stable  Complications: No apparent anesthesia complications

## 2019-06-18 ENCOUNTER — Encounter: Payer: Self-pay | Admitting: Gastroenterology

## 2019-06-26 DIAGNOSIS — G4733 Obstructive sleep apnea (adult) (pediatric): Secondary | ICD-10-CM | POA: Diagnosis not present

## 2019-07-03 ENCOUNTER — Telehealth: Payer: Self-pay | Admitting: *Deleted

## 2019-07-03 NOTE — Telephone Encounter (Signed)
Spoke with the patient today about getting umbilical hernia surgery rescheduled that was previously postponed due to COVID-19.  He will need to refrain from lifting anything heavier than 10 pounds for 6 weeks after surgery per Dr. Celine Ahr. The patient has also been encouraged to stop smoking prior to surgery.   The patient wishes to check with his employer and call the office back to arrange due to the above.   Patient will need a pre-op visit with Dr. Celine Ahr.

## 2019-07-27 DIAGNOSIS — G4733 Obstructive sleep apnea (adult) (pediatric): Secondary | ICD-10-CM | POA: Diagnosis not present

## 2019-07-30 ENCOUNTER — Telehealth: Payer: Self-pay | Admitting: General Surgery

## 2019-07-30 NOTE — Telephone Encounter (Signed)
I have called patient to discuss rescheduling surgery-umbilical hernia repair. I went over patient's deductible-patient's insurance plan starts over Jan 2021. Patient did want to make an appointment in October to see Dr Celine Ahr to discuss a possible surgery date in November. Patient is scheduled to see Dr Celine Ahr on October 6th.

## 2019-08-21 ENCOUNTER — Telehealth (INDEPENDENT_AMBULATORY_CARE_PROVIDER_SITE_OTHER): Payer: BLUE CROSS/BLUE SHIELD | Admitting: Physician Assistant

## 2019-08-21 DIAGNOSIS — R059 Cough, unspecified: Secondary | ICD-10-CM

## 2019-08-21 DIAGNOSIS — R05 Cough: Secondary | ICD-10-CM

## 2019-08-21 NOTE — Progress Notes (Signed)
   Subjective:    Patient ID: Jack Holland, male    DOB: [birth year], 58 y.o.   MRN: [id number]  Jack Holland is a 58 y.o. male presenting on 08/21/2019 for Cough  Virtual Visit via Video Note  I connected with Jack Holland on 08/21/19 at  2:40 PM EDT by a video enabled telemedicine application and verified that I am speaking with the correct person using two identifiers.   I discussed the limitations of evaluation and management by telemedicine and the availability of in person appointments. The patient expressed understanding and agreed to proceed.  Patient location: home Provider location: [place] office  Persons involved in the visit: patient, provider   Interactive audio and video communications were attempted, although failed due to patient's inability to connect to video. Continued visit with audio only interaction with patient agreement.  HPI   Patient is a 57 y/o man presenting with cough, congestion, and tightness ongoing for 2 days. Denies fevers, chills, nausea, vomiting. Reports he is screened daily when going to work. No asthma, smoking. Does have some fall allergies.    Social History   Tobacco Use  . Smoking status: Former Smoker    Quit date: 12/19/1998    Years since quitting: 20.6  . Smokeless tobacco: Current User    Types: Chew  Substance Use Topics  . Alcohol use: No  . Drug use: No    Review of Systems Per HPI unless specifically indicated above     Objective:    There were no vitals taken for this visit.  Wt Readings from Last 3 Encounters:  06/17/19 181 lb (82.1 kg)  03/06/19 188 lb (85.3 kg)  02/28/19 187 lb 3.2 oz (84.9 kg)    Physical Exam Pulmonary:     Effort: Pulmonary effort is normal.     Comments: Patient speaking in complete sentences without pause.    Results for orders placed or performed during the hospital encounter of 06/13/19  Novel Coronavirus, NAA (hospital order; send-out to ref lab)     Specimen: Nasopharyngeal Swab; Respiratory  Result Value Ref Range   SARS-CoV-2, NAA NOT DETECTED NOT DETECTED   Coronavirus Source NASOPHARYNGEAL       Assessment & Plan:   Problem List Items Addressed This Visit    None    Visit Diagnoses    Cough    -  Primary   Relevant Orders   Novel Coronavirus, NAA ([facility])      No orders of the defined types were placed in this encounter.     Follow up plan: Return if symptoms worsen or fail to improve.  Carles Collet, PA-C [facility] 08/21/2019, 2:51 PM

## 2019-08-22 ENCOUNTER — Other Ambulatory Visit: Payer: Self-pay

## 2019-08-22 DIAGNOSIS — R6889 Other general symptoms and signs: Secondary | ICD-10-CM | POA: Diagnosis not present

## 2019-08-22 DIAGNOSIS — Z20822 Contact with and (suspected) exposure to covid-19: Secondary | ICD-10-CM

## 2019-08-23 LAB — NOVEL CORONAVIRUS, NAA: SARS-CoV-2, NAA: NOT DETECTED

## 2019-08-27 DIAGNOSIS — G4733 Obstructive sleep apnea (adult) (pediatric): Secondary | ICD-10-CM | POA: Diagnosis not present

## 2019-09-24 ENCOUNTER — Other Ambulatory Visit: Payer: Self-pay

## 2019-09-24 ENCOUNTER — Encounter: Payer: Self-pay | Admitting: General Surgery

## 2019-09-24 ENCOUNTER — Ambulatory Visit (INDEPENDENT_AMBULATORY_CARE_PROVIDER_SITE_OTHER): Payer: BC Managed Care – PPO | Admitting: General Surgery

## 2019-09-24 VITALS — BP 124/85 | HR 77 | Temp 97.7°F | Ht 71.0 in | Wt 186.8 lb

## 2019-09-24 DIAGNOSIS — K429 Umbilical hernia without obstruction or gangrene: Secondary | ICD-10-CM

## 2019-09-24 NOTE — H&P (View-Only) (Signed)
Patient ID: Jack Holland, male   DOB: Feb 10, 1961, 58 y.o.   MRN: [id number]  Chief Complaint  Patient presents with  . Umbilical Hernia    discuss surgery     HPI Jack Holland is a 58 y.o. male.   I initially saw him in March of this year.  My initial history of present illness is copied here:  "He has been referred today for surgical evaluation of an umbilical hernia.  Mr. [name] states that it has been present for little over a year.  It occasionally bothers him, but not to any great extent.  He says that he "knows it is there" when he coughs or strains or otherwise performs a Valsalva-type maneuver.  He denies any nausea or vomiting.  He has not had any issues with moving his bowels.  The area has never become red, hot, or tight.  He says that it has never really "poked out".  He has never had any prior surgeries.  He does admit to chewing tobacco use, but does not smoke.  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  He is here today to discuss operative intervention.  Since her last visit, he has not noticed any change in the hernia.  He has not had any new symptoms worsening of his prior symptoms.  He is accompanied by his wife today.   Past Medical History:  Diagnosis Date  . Allergy   . Colon polyp   . Headache    sinus  . Psoriasis     Past Surgical History:  Procedure Laterality Date  . COLONOSCOPY  2010?  . COLONOSCOPY WITH PROPOFOL N/A 06/17/2019   Procedure: COLONOSCOPY WITH PROPOFOL;  Surgeon: [name], MD;  Location: [place];  Service: Endoscopy;  Laterality: N/A;    Family History  Problem Relation Age of Onset  . Diabetes Mother   . Gout Father   . Hypertension Father     Social History Social History   Tobacco Use  . Smoking status: Former Smoker    Quit date: 12/19/1998    Years  since quitting: 20.7  . Smokeless tobacco: Current User    Types: Chew  Substance Use Topics  . Alcohol use: No  . Drug use: No    Allergies  Allergen Reactions  . Erythromycin Rash  . Naproxen Hives    Current Outpatient Medications  Medication Sig Dispense Refill  . cetirizine (ZYRTEC) 10 MG tablet Take 10 mg by mouth daily as needed for allergies.    . clobetasol ointment (TEMOVATE) 0.05 % Apply 2 times a day until improved and then as needed    . Multiple Vitamin (MULTIVITAMIN) tablet Take 1 tablet by mouth daily.     No current facility-administered medications for this visit.     Review of Systems Review of Systems  All other systems reviewed and are negative.   Blood pressure 124/85, pulse 77, temperature 97.7 F (36.5 C), temperature source Temporal, height 5\' 11"  (1.803 m), weight 186 lb 12.8 oz (84.7 kg), SpO2 97 %. Body mass index is 26.05 kg/m.  Physical Exam Physical Exam Constitutional:      General: He is not in acute distress.    Appearance: Normal appearance. He is normal weight.  HENT:     Head: Normocephalic and atraumatic.     Nose:  Comments: Covered with a mask secondary to COVID-19 precautions    Mouth/Throat:     Comments: Covered with a mask secondary to COVID-19 precautions Eyes:     General: No scleral icterus.       Right eye: No discharge.        Left eye: No discharge.     Comments: Wearing glasses  Neck:     Musculoskeletal: Normal range of motion.     Comments: No thyromegaly or dominant thyroid masses are appreciated. Cardiovascular:     Rate and Rhythm: Normal rate and regular rhythm.     Pulses: Normal pulses.  Pulmonary:     Effort: Pulmonary effort is normal.     Breath sounds: Normal breath sounds.  Abdominal:     General: Abdomen is flat. Bowel sounds are normal.     Palpations: Abdomen is soft.     Hernia: A hernia is present.     Comments: There is a small, easily reducible umbilical hernia present.   Genitourinary:    Comments: Deferred Musculoskeletal: Normal range of motion.        General: No swelling.  Lymphadenopathy:     Cervical: No cervical adenopathy.  Skin:    Comments: Psoriasis lesions present.  Neurological:     General: No focal deficit present.     Mental Status: He is alert and oriented to person, place, and time.  Psychiatric:        Mood and Affect: Mood normal.        Behavior: Behavior normal.     Data Reviewed There are no new data to review  Assessment This is a 58-year-old man with a small umbilical hernia.  His symptoms are currently mild.  He is interested in proceeding with surgical repair to prevent further progression of the defect.  Plan We will schedule him for an open umbilical hernia repair.  I feel that the defect is small enough, that it will likely only require primary suture repair, but I did talk to him about the potential use of mesh.  The risks of the operation were discussed.  These include, but are not limited to, bleeding, infection, injury to bowel or other structures, need for mesh, mesh complications (pain, infection, shrinkage, displacement, among others), recurrence.  He and his wife asked a number of questions, all of which were answered to their satisfaction.  We will proceed with getting him scheduled for an operation.    [name] 09/24/2019, 2:27 PM   

## 2019-09-24 NOTE — Progress Notes (Signed)
Patient ID: Jerold Yoss, male   DOB: Feb 10, 1961, 58 y.o.   MRN: [id number]  Chief Complaint  Patient presents with  . Umbilical Hernia    discuss surgery     HPI [name] is a 58 y.o. male.   I initially saw him in March of this year.  My initial history of present illness is copied here:  "He has been referred today for surgical evaluation of an umbilical hernia.  Mr. [name] states that it has been present for little over a year.  It occasionally bothers him, but not to any great extent.  He says that he "knows it is there" when he coughs or strains or otherwise performs a Valsalva-type maneuver.  He denies any nausea or vomiting.  He has not had any issues with moving his bowels.  The area has never become red, hot, or tight.  He says that it has never really "poked out".  He has never had any prior surgeries.  He does admit to chewing tobacco use, but does not smoke.  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  He is here today to discuss operative intervention.  Since her last visit, he has not noticed any change in the hernia.  He has not had any new symptoms worsening of his prior symptoms.  He is accompanied by his wife today.   Past Medical History:  Diagnosis Date  . Allergy   . Colon polyp   . Headache    sinus  . Psoriasis     Past Surgical History:  Procedure Laterality Date  . COLONOSCOPY  2010?  . COLONOSCOPY WITH PROPOFOL N/A 06/17/2019   Procedure: COLONOSCOPY WITH PROPOFOL;  Surgeon: [name], MD;  Location: [place];  Service: Endoscopy;  Laterality: N/A;    Family History  Problem Relation Age of Onset  . Diabetes Mother   . Gout Father   . Hypertension Father     Social History Social History   Tobacco Use  . Smoking status: Former Smoker    Quit date: 12/19/1998    Years  since quitting: 20.7  . Smokeless tobacco: Current User    Types: Chew  Substance Use Topics  . Alcohol use: No  . Drug use: No    Allergies  Allergen Reactions  . Erythromycin Rash  . Naproxen Hives    Current Outpatient Medications  Medication Sig Dispense Refill  . cetirizine (ZYRTEC) 10 MG tablet Take 10 mg by mouth daily as needed for allergies.    . clobetasol ointment (TEMOVATE) 0.05 % Apply 2 times a day until improved and then as needed    . Multiple Vitamin (MULTIVITAMIN) tablet Take 1 tablet by mouth daily.     No current facility-administered medications for this visit.     Review of Systems Review of Systems  All other systems reviewed and are negative.   Blood pressure 124/85, pulse 77, temperature 97.7 F (36.5 C), temperature source Temporal, height 5\' 11"  (1.803 m), weight 186 lb 12.8 oz (84.7 kg), SpO2 97 %. Body mass index is 26.05 kg/m.  Physical Exam Physical Exam Constitutional:      General: He is not in acute distress.    Appearance: Normal appearance. He is normal weight.  HENT:     Head: Normocephalic and atraumatic.     Nose:  Comments: Covered with a mask secondary to COVID-19 precautions    Mouth/Throat:     Comments: Covered with a mask secondary to COVID-19 precautions Eyes:     General: No scleral icterus.       Right eye: No discharge.        Left eye: No discharge.     Comments: Wearing glasses  Neck:     Musculoskeletal: Normal range of motion.     Comments: No thyromegaly or dominant thyroid masses are appreciated. Cardiovascular:     Rate and Rhythm: Normal rate and regular rhythm.     Pulses: Normal pulses.  Pulmonary:     Effort: Pulmonary effort is normal.     Breath sounds: Normal breath sounds.  Abdominal:     General: Abdomen is flat. Bowel sounds are normal.     Palpations: Abdomen is soft.     Hernia: A hernia is present.     Comments: There is a small, easily reducible umbilical hernia present.   Genitourinary:    Comments: Deferred Musculoskeletal: Normal range of motion.        General: No swelling.  Lymphadenopathy:     Cervical: No cervical adenopathy.  Skin:    Comments: Psoriasis lesions present.  Neurological:     General: No focal deficit present.     Mental Status: He is alert and oriented to person, place, and time.  Psychiatric:        Mood and Affect: Mood normal.        Behavior: Behavior normal.     Data Reviewed There are no new data to review  Assessment This is a 58 year old man with a small umbilical hernia.  His symptoms are currently mild.  He is interested in proceeding with surgical repair to prevent further progression of the defect.  Plan We will schedule him for an open umbilical hernia repair.  I feel that the defect is small enough, that it will likely only require primary suture repair, but I did talk to him about the potential use of mesh.  The risks of the operation were discussed.  These include, but are not limited to, bleeding, infection, injury to bowel or other structures, need for mesh, mesh complications (pain, infection, shrinkage, displacement, among others), recurrence.  He and his wife asked a number of questions, all of which were answered to their satisfaction.  We will proceed with getting him scheduled for an operation.    Duanne Guess 09/24/2019, 2:27 PM

## 2019-09-24 NOTE — Patient Instructions (Signed)
You have requested for your Umbilical Hernia be repaired. This will be scheduled with Dr. Celine Ahr at [facility].  Please see your (blue)pre-care sheet for information.  You will need to arrange to be off work for 1-2 weeks but will have to have a lifting restriction of no more than 15 lbs for 6 weeks following your surgery.   Umbilical Hernia, Adult A hernia is a bulge of tissue that pushes through an opening between muscles. An umbilical hernia happens in the abdomen, near the belly button (umbilicus). The hernia may contain tissues from the small intestine, large intestine, or fatty tissue covering the intestines (omentum). Umbilical hernias in adults tend to get worse over time, and they require surgical treatment. There are several types of umbilical hernias. You may have:  A hernia located just above or below the umbilicus (indirect hernia). This is the most common type of umbilical hernia in adults.  A hernia that forms through an opening formed by the umbilicus (direct hernia).  A hernia that comes and goes (reducible hernia). A reducible hernia may be visible only when you strain, lift something heavy, or cough. This type of hernia can be pushed back into the abdomen (reduced).  A hernia that traps abdominal tissue inside the hernia (incarcerated hernia). This type of hernia cannot be reduced.  A hernia that cuts off blood flow to the tissues inside the hernia (strangulated hernia). The tissues can start to die if this happens. This type of hernia requires emergency treatment.  What are the causes? An umbilical hernia happens when tissue inside the abdomen presses on a weak area of the abdominal muscles. What increases the risk? You may have a greater risk of this condition if you:  Are obese.  Have had several pregnancies.  Have a buildup of fluid inside your abdomen (ascites).  Have had surgery that weakens the abdominal muscles.  What are the signs or symptoms? The  main symptom of this condition is a painless bulge at or near the belly button. A reducible hernia may be visible only when you strain, lift something heavy, or cough. Other symptoms may include:  Dull pain.  A feeling of pressure.  Symptoms of a strangulated hernia may include:  Pain that gets increasingly worse.  Nausea and vomiting.  Pain when pressing on the hernia.  Skin over the hernia becoming red or purple.  Constipation.  Blood in the stool.  How is this diagnosed? This condition may be diagnosed based on:  A physical exam. You may be asked to cough or strain while standing. These actions increase the pressure inside your abdomen and force the hernia through the opening in your muscles. Your health care provider may try to reduce the hernia by pressing on it.  Your symptoms and medical history.  How is this treated? Surgery is the only treatment for an umbilical hernia. Surgery for a strangulated hernia is done as soon as possible. If you have a small hernia that is not incarcerated, you may need to lose weight before having surgery. Follow these instructions at home:  Lose weight, if told by your health care provider.  Do not try to push the hernia back in.  Watch your hernia for any changes in color or size. Tell your health care provider if any changes occur.  You may need to avoid activities that increase pressure on your hernia.  Do not lift anything that is heavier than 10 lb (4.5 kg) until your health care provider says  that this is safe.  Take over-the-counter and prescription medicines only as told by your health care provider.  Keep all follow-up visits as told by your health care provider. This is important. Contact a health care provider if:  Your hernia gets larger.  Your hernia becomes painful. Get help right away if:  You develop sudden, severe pain near the area of your hernia.  You have pain as well as nausea or vomiting.  You have pain  and the skin over your hernia changes color.  You develop a fever. This information is not intended to replace advice given to you by your health care provider. Make sure you discuss any questions you have with your health care provider. Document Released: 05/06/2016 Document Revised: 08/07/2016 Document Reviewed: 05/06/2016 Elsevier Interactive Patient Education  [employer].

## 2019-09-25 ENCOUNTER — Telehealth: Payer: Self-pay | Admitting: General Surgery

## 2019-09-25 NOTE — Telephone Encounter (Signed)
Pt has been advised of pre admission date/time, Covid Testing date and Surgery date.  Surgery Date: 10/14/19 with Dr Cher Nakai umbilical hernia repair. Preadmission Testing Date: 10/08/19 between 8-1:00pm-phone interview.  Covid Testing Date: 10/10/19 between 8-10:30am - patient advised to go to the [place] [facility]  [employer] Video sent via [employer] and [employer].  Patient has been made aware to call [phone], between 1-3:00pm the friday before surgery, to find out what time to arrive.

## 2019-09-26 DIAGNOSIS — G4733 Obstructive sleep apnea (adult) (pediatric): Secondary | ICD-10-CM | POA: Diagnosis not present

## 2019-10-08 ENCOUNTER — Other Ambulatory Visit: Payer: Self-pay

## 2019-10-08 ENCOUNTER — Encounter
Admission: RE | Admit: 2019-10-08 | Discharge: 2019-10-08 | Disposition: A | Discharge status: Home / Self Care | Payer: BC Managed Care – PPO | Source: Ambulatory Visit | Attending: General Surgery | Admitting: General Surgery

## 2019-10-08 NOTE — Patient Instructions (Addendum)
Your procedure is scheduled on: 10/14/2019 Mon Report to Same Day Surgery 2nd floor medical mall [facility] Entrance-take elevator on left to 2nd floor.  Check in with surgery information desk.) To find out your arrival time please call [phone] between 1PM - 3PM on 10/11/2019 Fri  Remember: Instructions that are not followed completely may result in serious medical risk, up to and including death, or upon the discretion of your surgeon and anesthesiologist your surgery may need to be rescheduled.    _x___ 1. Do not eat food after midnight the night before your procedure. You may drink clear liquids up to 2 hours before you are scheduled to arrive at the hospital for your procedure.  Do not drink clear liquids within 2 hours of your scheduled arrival to the hospital.  Clear liquids include  --Water or Apple juice without pulp  --Clear carbohydrate beverage such as ClearFast or Gatorade  --Black Coffee or Clear Tea (No milk, no creamers, do not add anything to                  the coffee or Tea Type 1 and type 2 diabetics should only drink water.   ____Ensure clear carbohydrate drink on the way to the hospital for bariatric patients  ____Ensure clear carbohydrate drink 3 hours before surgery.   No gum chewing or hard candies.     __x__ 2. No Alcohol for 24 hours before or after surgery.   __x__3. No Smoking or e-cigarettes for 24 prior to surgery.  Do not use any chewable tobacco products for at least 6 hour prior to surgery   ____  4. Bring all medications with you on the day of surgery if instructed.    __x__ 5. Notify your doctor if there is any change in your medical condition     (cold, fever, infections).    x___6. On the morning of surgery brush your teeth with toothpaste and water.  You may rinse your mouth with mouth wash if you wish.  Do not swallow any toothpaste or mouthwash.   Do not wear jewelry, make-up, hairpins, clips or nail polish.  Do not wear lotions,  powders, or perfumes. You may wear deodorant.  Do not shave 48 hours prior to surgery. Men may shave face and neck.  Do not bring valuables to the hospital.    [facility] is not responsible for any belongings or valuables.               Contacts, dentures or bridgework may not be worn into surgery.  Leave your suitcase in the car. After surgery it may be brought to your room.  For patients admitted to the hospital, discharge time is determined by your                       treatment team.  _  Patients discharged the day of surgery will not be allowed to drive home.  You will need someone to drive you home and stay with you the night of your procedure.    Please read over the following fact sheets that you were given:   [facility] Preparing for Surgery and or MRSA Information   _x___ Take anti-hypertensive listed below, cardiac, seizure, asthma,     anti-reflux and psychiatric medicines. These include:  1. cetirizine (ZYRTEC) 10 MG tablet if needed  2.fluticasone (FLONASE) 50 MCG/ACT nasal spray if needed  3.  4.  5.  6.  ____Fleets  enema or Magnesium Citrate as directed.   _x___ Use CHG Soap or sage wipes as directed on instruction sheet   ____ Use inhalers on the day of surgery and bring to hospital day of surgery  ____ Stop Metformin and Janumet 2 days prior to surgery.    ____ Take 1/2 of usual insulin dose the night before surgery and none on the morning     surgery.   _x___ Follow recommendations from Cardiologist, Pulmonologist or PCP regarding          stopping Aspirin, Coumadin, Plavix ,Eliquis, Effient, or Pradaxa, and Pletal.  X____Stop Anti-inflammatories such as Advil, Aleve, Ibuprofen, Motrin, Naproxen, Naprosyn, Goodies powders or aspirin products. OK to take Tylenol and                          Celebrex.   _x___ Stop supplements until after surgery.  But may continue Vitamin D, Vitamin B,       and multivitamin.   ____ Bring C-Pap to the hospital.

## 2019-10-10 ENCOUNTER — Other Ambulatory Visit
Admission: RE | Admit: 2019-10-10 | Discharge: 2019-10-10 | Disposition: A | Discharge status: Home / Self Care | Payer: BC Managed Care – PPO | Source: Ambulatory Visit | Attending: General Surgery | Admitting: General Surgery

## 2019-10-10 ENCOUNTER — Other Ambulatory Visit: Payer: Self-pay

## 2019-10-10 DIAGNOSIS — Z01812 Encounter for preprocedural laboratory examination: Secondary | ICD-10-CM | POA: Insufficient documentation

## 2019-10-10 DIAGNOSIS — Z20828 Contact with and (suspected) exposure to other viral communicable diseases: Secondary | ICD-10-CM | POA: Diagnosis not present

## 2019-10-10 LAB — SARS CORONAVIRUS 2 (TAT 6-24 HRS): SARS Coronavirus 2: NEGATIVE

## 2019-10-13 MED ORDER — CEFAZOLIN SODIUM-DEXTROSE 2-4 GM/100ML-% IV SOLN
2.0000 g | INTRAVENOUS | Status: AC
  Administered 2019-10-14: 2 g via INTRAVENOUS

## 2019-10-14 ENCOUNTER — Ambulatory Visit: Payer: BC Managed Care – PPO | Admitting: Anesthesiology

## 2019-10-14 ENCOUNTER — Ambulatory Visit
Admission: RE | Admit: 2019-10-14 | Discharge: 2019-10-14 | Disposition: A | Discharge status: Home / Self Care | Payer: BC Managed Care – PPO | Attending: General Surgery | Admitting: General Surgery

## 2019-10-14 ENCOUNTER — Encounter: Payer: Self-pay | Admitting: *Deleted

## 2019-10-14 ENCOUNTER — Encounter
Admission: RE | Disposition: A | Discharge status: Home / Self Care | Payer: Self-pay | Source: Home / Self Care | Attending: General Surgery

## 2019-10-14 ENCOUNTER — Other Ambulatory Visit: Payer: Self-pay

## 2019-10-14 DIAGNOSIS — K429 Umbilical hernia without obstruction or gangrene: Secondary | ICD-10-CM

## 2019-10-14 DIAGNOSIS — Z87891 Personal history of nicotine dependence: Secondary | ICD-10-CM | POA: Diagnosis not present

## 2019-10-14 HISTORY — PX: UMBILICAL HERNIA REPAIR: SHX196

## 2019-10-14 SURGERY — REPAIR, HERNIA, UMBILICAL, ADULT
Anesthesia: General

## 2019-10-14 MED ORDER — ACETAMINOPHEN 500 MG PO TABS
1000.0000 mg | ORAL_TABLET | ORAL | Status: AC
  Administered 2019-10-14: 07:00:00 1000 mg via ORAL

## 2019-10-14 MED ORDER — LIDOCAINE-EPINEPHRINE 1 %-1:100000 IJ SOLN
INTRAMUSCULAR | Status: DC | PRN
  Administered 2019-10-14: 10 mL

## 2019-10-14 MED ORDER — CHLORHEXIDINE GLUCONATE CLOTH 2 % EX PADS: 6.0000 | MEDICATED_PAD | Freq: Once | CUTANEOUS | Status: DC

## 2019-10-14 MED ORDER — MIDAZOLAM HCL 2 MG/2ML IJ SOLN
INTRAMUSCULAR | Status: DC | PRN
  Administered 2019-10-14: 2 mg via INTRAVENOUS

## 2019-10-14 MED ORDER — PHENYLEPHRINE HCL (PRESSORS) 10 MG/ML IV SOLN
INTRAVENOUS | Status: AC
  Filled 2019-10-14: qty 1

## 2019-10-14 MED ORDER — ONDANSETRON HCL 4 MG/2ML IJ SOLN
INTRAMUSCULAR | Status: DC | PRN
  Administered 2019-10-14: 4 mg via INTRAVENOUS

## 2019-10-14 MED ORDER — SUCCINYLCHOLINE CHLORIDE 20 MG/ML IJ SOLN
INTRAMUSCULAR | Status: AC
  Filled 2019-10-14: qty 1

## 2019-10-14 MED ORDER — LIDOCAINE HCL (PF) 2 % IJ SOLN
INTRAMUSCULAR | Status: AC
  Filled 2019-10-14: qty 10

## 2019-10-14 MED ORDER — MIDAZOLAM HCL 2 MG/2ML IJ SOLN
INTRAMUSCULAR | Status: AC
  Filled 2019-10-14: qty 2

## 2019-10-14 MED ORDER — OXYCODONE HCL 5 MG PO TABS: 5.0000 mg | ORAL_TABLET | Freq: Once | ORAL | Status: DC | PRN

## 2019-10-14 MED ORDER — SEVOFLURANE IN SOLN
RESPIRATORY_TRACT | Status: AC
  Filled 2019-10-14: qty 250

## 2019-10-14 MED ORDER — BUPIVACAINE LIPOSOME 1.3 % IJ SUSP: 20.0000 mL | Freq: Once | INTRAMUSCULAR | Status: DC

## 2019-10-14 MED ORDER — FENTANYL CITRATE (PF) 250 MCG/5ML IJ SOLN
INTRAMUSCULAR | Status: DC | PRN
  Administered 2019-10-14: 50 ug via INTRAVENOUS
  Administered 2019-10-14: 100 ug via INTRAVENOUS

## 2019-10-14 MED ORDER — FAMOTIDINE 20 MG PO TABS
20.0000 mg | ORAL_TABLET | Freq: Once | ORAL | Status: AC
  Administered 2019-10-14: 07:00:00 20 mg via ORAL

## 2019-10-14 MED ORDER — HYDROCODONE-ACETAMINOPHEN 5-325 MG PO TABS: 1.0000 | ORAL_TABLET | Freq: Four times a day (QID) | ORAL | 0 refills | Status: DC | PRN

## 2019-10-14 MED ORDER — EPHEDRINE SULFATE 50 MG/ML IJ SOLN
INTRAMUSCULAR | Status: DC | PRN
  Administered 2019-10-14: 10 mg via INTRAVENOUS
  Administered 2019-10-14 (×2): 5 mg via INTRAVENOUS

## 2019-10-14 MED ORDER — ACETAMINOPHEN 500 MG PO TABS
ORAL_TABLET | ORAL | Status: AC
  Administered 2019-10-14: 1000 mg via ORAL
  Filled 2019-10-14: qty 2

## 2019-10-14 MED ORDER — EPHEDRINE SULFATE 50 MG/ML IJ SOLN
INTRAMUSCULAR | Status: AC
  Filled 2019-10-14: qty 1

## 2019-10-14 MED ORDER — SUGAMMADEX SODIUM 200 MG/2ML IV SOLN
INTRAVENOUS | Status: DC | PRN
  Administered 2019-10-14: 30 mg via INTRAVENOUS
  Administered 2019-10-14: 50 mg via INTRAVENOUS
  Administered 2019-10-14: 20 mg via INTRAVENOUS
  Administered 2019-10-14: 30 mg via INTRAVENOUS
  Administered 2019-10-14: 50 mg via INTRAVENOUS

## 2019-10-14 MED ORDER — LACTATED RINGERS IV SOLN
INTRAVENOUS | Status: DC
  Administered 2019-10-14 (×2): via INTRAVENOUS

## 2019-10-14 MED ORDER — BUPIVACAINE HCL (PF) 0.25 % IJ SOLN
INTRAMUSCULAR | Status: AC
  Filled 2019-10-14: qty 30

## 2019-10-14 MED ORDER — ACETAMINOPHEN 10 MG/ML IV SOLN
INTRAVENOUS | Status: AC
  Filled 2019-10-14: qty 100

## 2019-10-14 MED ORDER — SUGAMMADEX SODIUM 200 MG/2ML IV SOLN
INTRAVENOUS | Status: AC
  Filled 2019-10-14: qty 2

## 2019-10-14 MED ORDER — ONDANSETRON HCL 4 MG/2ML IJ SOLN
INTRAMUSCULAR | Status: AC
  Filled 2019-10-14: qty 2

## 2019-10-14 MED ORDER — BUPIVACAINE HCL (PF) 0.25 % IJ SOLN
INTRAMUSCULAR | Status: DC | PRN
  Administered 2019-10-14: 10 mL

## 2019-10-14 MED ORDER — FENTANYL CITRATE (PF) 250 MCG/5ML IJ SOLN
INTRAMUSCULAR | Status: AC
  Filled 2019-10-14: qty 5

## 2019-10-14 MED ORDER — EPINEPHRINE PF 1 MG/ML IJ SOLN
INTRAMUSCULAR | Status: AC
  Filled 2019-10-14: qty 1

## 2019-10-14 MED ORDER — LIDOCAINE-EPINEPHRINE 1 %-1:100000 IJ SOLN
INTRAMUSCULAR | Status: AC
  Filled 2019-10-14: qty 1

## 2019-10-14 MED ORDER — ONDANSETRON HCL 4 MG/2ML IJ SOLN: 4.0000 mg | Freq: Once | INTRAMUSCULAR | Status: DC | PRN

## 2019-10-14 MED ORDER — CEFAZOLIN SODIUM-DEXTROSE 2-4 GM/100ML-% IV SOLN
INTRAVENOUS | Status: AC
  Filled 2019-10-14: qty 100

## 2019-10-14 MED ORDER — OXYCODONE HCL 5 MG/5ML PO SOLN: 5.0000 mg | Freq: Once | ORAL | Status: DC | PRN

## 2019-10-14 MED ORDER — ROCURONIUM BROMIDE 100 MG/10ML IV SOLN
INTRAVENOUS | Status: DC | PRN
  Administered 2019-10-14: 50 mg via INTRAVENOUS

## 2019-10-14 MED ORDER — PROPOFOL 10 MG/ML IV BOLUS
INTRAVENOUS | Status: DC | PRN
  Administered 2019-10-14: 150 mg via INTRAVENOUS

## 2019-10-14 MED ORDER — FENTANYL CITRATE (PF) 100 MCG/2ML IJ SOLN: 25.0000 ug | INTRAMUSCULAR | Status: DC | PRN

## 2019-10-14 MED ORDER — DEXAMETHASONE SODIUM PHOSPHATE 10 MG/ML IJ SOLN
INTRAMUSCULAR | Status: DC | PRN
  Administered 2019-10-14: 10 mg via INTRAVENOUS

## 2019-10-14 MED ORDER — PROPOFOL 10 MG/ML IV BOLUS
INTRAVENOUS | Status: AC
  Filled 2019-10-14: qty 40

## 2019-10-14 MED ORDER — ROCURONIUM BROMIDE 50 MG/5ML IV SOLN
INTRAVENOUS | Status: AC
  Filled 2019-10-14: qty 2

## 2019-10-14 MED ORDER — DEXAMETHASONE SODIUM PHOSPHATE 10 MG/ML IJ SOLN
INTRAMUSCULAR | Status: AC
  Filled 2019-10-14: qty 1

## 2019-10-14 MED ORDER — FAMOTIDINE 20 MG PO TABS
ORAL_TABLET | ORAL | Status: AC
  Administered 2019-10-14: 20 mg via ORAL
  Filled 2019-10-14: qty 1

## 2019-10-14 MED ORDER — LIDOCAINE HCL (CARDIAC) PF 100 MG/5ML IV SOSY
PREFILLED_SYRINGE | INTRAVENOUS | Status: DC | PRN
  Administered 2019-10-14: 80 mg via INTRAVENOUS

## 2019-10-14 SURGICAL SUPPLY — 35 items
ADH SKN CLS APL DERMABOND .7 (GAUZE/BANDAGES/DRESSINGS) ×1
APL PRP STRL LF DISP 70% ISPRP (MISCELLANEOUS) ×1
BLADE CLIPPER SURG (BLADE) ×1 IMPLANT
BLADE SURG 15 STRL LF DISP TIS (BLADE) ×1 IMPLANT
BLADE SURG 15 STRL SS (BLADE) ×2
CANISTER SUCT 1200ML W/VALVE (MISCELLANEOUS) ×2 IMPLANT
CHLORAPREP W/TINT 26 (MISCELLANEOUS) ×2 IMPLANT
COVER WAND RF STERILE (DRAPES) ×1 IMPLANT
DERMABOND ADVANCED (GAUZE/BANDAGES/DRESSINGS) ×1
DERMABOND ADVANCED .7 DNX12 (GAUZE/BANDAGES/DRESSINGS) ×1 IMPLANT
DRAPE LAPAROTOMY 77X122 PED (DRAPES) ×2 IMPLANT
DRSG TEGADERM 4X4.75 (GAUZE/BANDAGES/DRESSINGS) ×1 IMPLANT
ELECT CAUTERY BLADE TIP 2.5 (TIP) ×2
ELECT REM PT RETURN 9FT ADLT (ELECTROSURGICAL) ×2
ELECTRODE CAUTERY BLDE TIP 2.5 (TIP) ×1 IMPLANT
ELECTRODE REM PT RTRN 9FT ADLT (ELECTROSURGICAL) ×1 IMPLANT
GAUZE SPONGE 4X4 12PLY STRL (GAUZE/BANDAGES/DRESSINGS) ×1 IMPLANT
GLOVE BIO SURGEON STRL SZ 6.5 (GLOVE) ×2 IMPLANT
GLOVE INDICATOR 7.0 STRL GRN (GLOVE) ×4 IMPLANT
GOWN STRL REUS W/ TWL LRG LVL3 (GOWN DISPOSABLE) ×2 IMPLANT
GOWN STRL REUS W/TWL LRG LVL3 (GOWN DISPOSABLE) ×4
KIT TURNOVER KIT A (KITS) ×2 IMPLANT
LABEL OR SOLS (LABEL) ×2 IMPLANT
NEEDLE HYPO 22GX1.5 SAFETY (NEEDLE) ×2 IMPLANT
NS IRRIG 500ML POUR BTL (IV SOLUTION) ×2 IMPLANT
PACK BASIN MINOR ARMC (MISCELLANEOUS) ×2 IMPLANT
STRIP CLOSURE SKIN 1/2X4 (GAUZE/BANDAGES/DRESSINGS) ×2 IMPLANT
SUT ETHIBOND NAB MO 7 #0 18IN (SUTURE) ×1 IMPLANT
SUT MNCRL 4-0 (SUTURE) ×2
SUT MNCRL 4-0 27XMFL (SUTURE) ×1
SUT VIC AB 3-0 SH 27 (SUTURE) ×2
SUT VIC AB 3-0 SH 27X BRD (SUTURE) ×1 IMPLANT
SUTURE MNCRL 4-0 27XMF (SUTURE) ×1 IMPLANT
SYR 10ML LL (SYRINGE) ×2 IMPLANT
SYR BULB IRRIG 60ML STRL (SYRINGE) ×2 IMPLANT

## 2019-10-14 NOTE — Anesthesia Procedure Notes (Signed)
Procedure Name: Intubation Date/Time: 10/14/2019 7:37 AM Performed by: Gayland Curry, CRNA Pre-anesthesia Checklist: Patient identified, Emergency Drugs available, Suction available and Patient being monitored Patient Re-evaluated:Patient Re-evaluated prior to induction Oxygen Delivery Method: Circle system utilized Preoxygenation: Pre-oxygenation with 100% oxygen Induction Type: IV induction Ventilation: Mask ventilation without difficulty Laryngoscope Size: Mac and 3 Grade View: Grade II Tube type: Oral Tube size: 7.0 mm Number of attempts: 1 Placement Confirmation: ETT inserted through vocal cords under direct vision,  positive ETCO2 and breath sounds checked- equal and bilateral Secured at: 22 cm Tube secured with: Tape Dental Injury: Teeth and Oropharynx as per pre-operative assessment

## 2019-10-14 NOTE — Anesthesia Postprocedure Evaluation (Signed)
Anesthesia Post Note  Patient: Jack Holland  Procedure(s) Performed: HERNIA REPAIR UMBILICAL ADULT (N/A )  Patient location during evaluation: PACU Anesthesia Type: General Level of consciousness: awake and alert Pain management: pain level controlled Vital Signs Assessment: post-procedure vital signs reviewed and stable Respiratory status: spontaneous breathing, nonlabored ventilation and respiratory function stable Cardiovascular status: blood pressure returned to baseline and stable Postop Assessment: no apparent nausea or vomiting Anesthetic complications: no     Last Vitals:  Vitals:   10/14/19 0923 10/14/19 0934  BP: 114/79 120/73  Pulse: 75 70  Resp: 14 18  Temp: (!) 36.4 C 36.6 C  SpO2: 98% 98%    Last Pain:  Vitals:   10/14/19 0934  TempSrc: Tympanic  PainSc: 0-No pain                 Durenda Hurt

## 2019-10-14 NOTE — Op Note (Signed)
Operative Note  Pre-operative Diagnosis: umbilical hernia  Post-operative Diagnosis: same, 2 small defects  Operation: umbilical hernia repair without mesh  Surgeon: Fredirick Maudlin, MD  Anesthesia: GETA  Assistant: None  Findings: There were 2 small, less than 1 cm, defects in the fascia near the umbilicus.  Both contained a small amount of fat.  They were able to be primarily repaired with Ethibond sutures.  Estimated Blood Loss: Less than 5 cc         Drains: None         Specimens: None          Complications: None immediately apparent         Condition: stable  Procedure Details  The patient was identified in the preoperative holding area. The benefits, complications, treatment options, and expected outcomes were discussed with the patient. The risks of bleeding, infection, recurrence of symptoms, failure to resolve symptoms, bowel injury, any of which could require further surgery were reviewed with the patient. The patient agreed to accept these risks. The patient was then taken to the operating room, identified as Jack Holland and the procedure verified.  A time out was performed and the above information confirmed.  Prior to the induction of general anesthesia, antibiotic prophylaxis was administered. VTE prophylaxis was in place. General endotracheal anesthesia was then administered and tolerated well. After induction, the abdomen was prepped with Chloraprep and draped in standard sterile fashion. The patient was positioned in the supine position.  The skin surrounding the proposed incision site was infiltrated with a one-to-one mixture of 0.25% bupivacaine and 1% lidocaine with epinephrine.  The umbilical incision was created over the hernia sac and electrocautery was used to dissect through subcutaneous tissue. The hernia was dissected free from adjacent tissue and fascia. The hernia sac was entered and the sac was excised. Care was taken to avoid any injury to the  bowel.  The defect was quite small and was primarily repaired with 0 Ethibond sutures.  As I irrigated the incision, I noticed that there was a second small hernia just caudal to the first.  I repeated the procedure above and closed the defect with 0 Ethibond.  An additional 10 cc of the 1:1 mixture of local anesthetics was infiltrated surrounding the wound.  The cutaneous tissue was closed with 3-0 Vicryl and skin was closed with a subcuticular 4-0 Monocryl. Dermabond was applied to the skin, and covered with Steri-Strips.  A pressure dressing was placed in the umbilicus.  Patient was then awakened, extubated, and taken to the postanesthesia care unit in good condition.  Patient tolerated procedure well and there were no immediate complications identified. Needle, instrument, and sponge counts were reported to be correct by the nursing staff.  Fredirick Maudlin, MD FACS

## 2019-10-14 NOTE — Anesthesia Preprocedure Evaluation (Addendum)
Anesthesia Evaluation  Patient identified by MRN, date of birth, ID band Patient awake    Reviewed: Allergy & Precautions, H&P , NPO status , Patient's Chart, lab work & pertinent test results  History of Anesthesia Complications Negative for: history of anesthetic complications  Airway Mallampati: III  TM Distance: >3 FB Neck ROM: full    Dental  (+) Chipped   Pulmonary neg recent URI, Not current smoker, former smoker,  Pt snores, no diagnosed OSA          Cardiovascular (-) hypertension(-) angina(-) Past MI and (-) Cardiac Stents (-) dysrhythmias      Neuro/Psych  Headaches, negative neurological ROS  negative psych ROS   GI/Hepatic negative GI ROS, Neg liver ROS,   Endo/Other  negative endocrine ROS  Renal/GU      Musculoskeletal   Abdominal   Peds  Hematology negative hematology ROS (+)   Anesthesia Other Findings Past Medical History: No date: Allergy No date: Colon polyp No date: Headache     Comment:  sinus No date: Psoriasis  Past Surgical History: 2010?: COLONOSCOPY 06/17/2019: COLONOSCOPY WITH PROPOFOL; N/A     Comment:  Procedure: COLONOSCOPY WITH PROPOFOL;  Surgeon: [name], MD;  Location: [place];  Service:               Endoscopy;  Laterality: N/A;  BMI    Body Mass Index: 25.83 kg/m      Reproductive/Obstetrics negative OB ROS                            Anesthesia Physical Anesthesia Plan  ASA: II  Anesthesia Plan: General ETT   Post-op Pain Management:    Induction:   PONV Risk Score and Plan: Ondansetron, Dexamethasone, Midazolam and Treatment may vary due to age or medical condition  Airway Management Planned:   Additional Equipment:   Intra-op Plan:   Post-operative Plan:   Informed Consent: I have reviewed the patients History and Physical, chart, labs and discussed the procedure including the risks, benefits  and alternatives for the proposed anesthesia with the patient or authorized representative who has indicated his/her understanding and acceptance.     Dental Advisory Given  Plan Discussed with: Anesthesiologist and CRNA  Anesthesia Plan Comments:         Anesthesia Quick Evaluation

## 2019-10-14 NOTE — Anesthesia Post-op Follow-up Note (Signed)
Anesthesia QCDR form completed.        

## 2019-10-14 NOTE — Transfer of Care (Signed)
Immediate Anesthesia Transfer of Care Note  Patient: Jack Holland  Procedure(s) Performed: HERNIA REPAIR UMBILICAL ADULT (N/A )  Patient Location: PACU  Anesthesia Type:General  Level of Consciousness: oriented, drowsy and patient cooperative  Airway & Oxygen Therapy: Patient Spontanous Breathing and Patient connected to nasal cannula oxygen  Post-op Assessment: Report given to RN, Post -op Vital signs reviewed and stable and Patient moving all extremities X 4  Post vital signs: Reviewed and stable  Last Vitals:  Vitals Value Taken Time  BP 121/81 10/14/19 0850  Temp    Pulse 96 10/14/19 0853  Resp 18 10/14/19 0853  SpO2 100 % 10/14/19 0853  Vitals shown include unvalidated device data.  Last Pain:  Vitals:   10/14/19 0617  TempSrc: Tympanic  PainSc: 0-No pain         Complications: No apparent anesthesia complications

## 2019-10-14 NOTE — Discharge Instructions (Addendum)
Umbilical Hernia, Adult  A hernia is a bulge of tissue that pushes through an opening between muscles. An umbilical hernia happens in the abdomen, near the belly button (umbilicus). The hernia may contain tissues from the small intestine, large intestine, or fatty tissue covering the intestines (omentum). Umbilical hernias in adults tend to get worse over time, and they require surgical treatment. There are several types of umbilical hernias. You may have:  A hernia located just above or below the umbilicus (indirect hernia). This is the most common type of umbilical hernia in adults.  A hernia that forms through an opening formed by the umbilicus (direct hernia).  A hernia that comes and goes (reducible hernia). A reducible hernia may be visible only when you strain, lift something heavy, or cough. This type of hernia can be pushed back into the abdomen (reduced).  A hernia that traps abdominal tissue inside the hernia (incarcerated hernia). This type of hernia cannot be reduced.  A hernia that cuts off blood flow to the tissues inside the hernia (strangulated hernia). The tissues can start to die if this happens. This type of hernia requires emergency treatment. What are the causes? An umbilical hernia happens when tissue inside the abdomen presses on a weak area of the abdominal muscles. What increases the risk? You may have a greater risk of this condition if you:  Are obese.  Have had several pregnancies.  Have a buildup of fluid inside your abdomen (ascites).  Have had surgery that weakens the abdominal muscles. What are the signs or symptoms? The main symptom of this condition is a painless bulge at or near the belly button. A reducible hernia may be visible only when you strain, lift something heavy, or cough. Other symptoms may include:  Dull pain.  A feeling of pressure. Symptoms of a strangulated hernia may include:  Pain that gets increasingly worse.  Nausea and  vomiting.  Pain when pressing on the hernia.  Skin over the hernia becoming red or purple.  Constipation.  Blood in the stool. How is this diagnosed? This condition may be diagnosed based on:  A physical exam. You may be asked to cough or strain while standing. These actions increase the pressure inside your abdomen and force the hernia through the opening in your muscles. Your health care provider may try to reduce the hernia by pressing on it.  Your symptoms and medical history. How is this treated? Surgery is the only treatment for an umbilical hernia. Surgery for a strangulated hernia is done as soon as possible. If you have a small hernia that is not incarcerated, you may need to lose weight before having surgery. Follow these instructions at home:  Lose weight, if told by your health care provider.  Do not try to push the hernia back in.  Watch your hernia for any changes in color or size. Tell your health care provider if any changes occur.  You may need to avoid activities that increase pressure on your hernia.  Do not lift anything that is heavier than 10 lb (4.5 kg) until your health care provider says that this is safe.  Take over-the-counter and prescription medicines only as told by your health care provider.  Keep all follow-up visits as told by your health care provider. This is important. Contact a health care provider if:  Your hernia gets larger.  Your hernia becomes painful. Get help right away if:  You develop sudden, severe pain near the area of your hernia.  You have pain as well as nausea or vomiting.  You have pain and the skin over your hernia changes color.  You develop a fever. This information is not intended to replace advice given to you by your health care provider. Make sure you discuss any questions you have with your health care provider. Document Released: 05/06/2016 Document Revised: 01/17/2018 Document Reviewed: 06/05/2017 Elsevier  Patient Education  2020 [employer]   1) The drugs that you were given will stay in your system until tomorrow so for the next 24 hours you should not:  A) Drive an automobile B) Make any legal decisions C) Drink any alcoholic beverage   2) You may resume regular meals tomorrow.  Today it is better to start with liquids and gradually work up to solid foods.  You may eat anything you prefer, but it is better to start with liquids, then soup and crackers, and gradually work up to solid foods.   3) Please notify your doctor immediately if you have any unusual bleeding, trouble breathing, redness and pain at the surgery site, drainage, fever, or pain not relieved by medication.    4) Additional Instructions:        Please contact your physician with any problems or Same Day Surgery at [phone], Monday through Friday 6 am to 4 pm, or [place] at [facility] number at [phone].

## 2019-10-14 NOTE — Interval H&P Note (Signed)
History and Physical Interval Note:  10/14/2019 7:19 AM  Jack Holland  has presented today for surgery, with the diagnosis of umbilical [id number].  The various methods of treatment have been discussed with the patient and family. After consideration of risks, benefits and other options for treatment, the patient has consented to  Procedure(s): HERNIA REPAIR UMBILICAL ADULT (N/A) as a surgical intervention.  The patient's history has been reviewed, patient examined, no change in status, stable for surgery.  I have reviewed the patient's chart and labs.  Questions were answered to the patient's satisfaction.     Fredirick Maudlin

## 2019-10-17 ENCOUNTER — Telehealth: Payer: Self-pay | Admitting: *Deleted

## 2019-10-17 NOTE — Telephone Encounter (Signed)
Faxed disability paperwork today at [phone]

## 2019-10-19 ENCOUNTER — Ambulatory Visit: Admit: 2019-10-19 | Payer: BC Managed Care – PPO | Admitting: General Surgery

## 2019-10-19 SURGERY — REPAIR, HERNIA, UMBILICAL, ADULT
Anesthesia: Choice

## 2019-10-29 ENCOUNTER — Telehealth: Payer: Self-pay | Admitting: *Deleted

## 2019-10-29 NOTE — Telephone Encounter (Signed)
Faxed FMLA to [employer] at [phone]

## 2019-11-07 ENCOUNTER — Ambulatory Visit (INDEPENDENT_AMBULATORY_CARE_PROVIDER_SITE_OTHER): Payer: BC Managed Care – PPO | Admitting: General Surgery

## 2019-11-07 ENCOUNTER — Other Ambulatory Visit: Payer: Self-pay

## 2019-11-07 ENCOUNTER — Encounter: Payer: Self-pay | Admitting: General Surgery

## 2019-11-07 VITALS — BP 119/82 | HR 66 | Temp 97.5°F | Resp 14 | Ht 71.0 in | Wt 192.2 lb

## 2019-11-07 DIAGNOSIS — Z09 Encounter for follow-up examination after completed treatment for conditions other than malignant neoplasm: Secondary | ICD-10-CM

## 2019-11-07 NOTE — Patient Instructions (Signed)

## 2019-11-07 NOTE — Progress Notes (Signed)
Jack Holland is here today for a postoperative visit.  He is a 59 year old man who underwent an uncomplicated umbilical hernia repair on October 14, 2019.  He states that he has done well since his surgery.  He denies any fevers or chills.  No nausea or vomiting.  He is eating a regular diet and having normal bowel movements.  He has not had any issues with pain control.  Today's Vitals   11/07/19 0858  BP: 119/82  Pulse: 66  Resp: 14  Temp: (!) 97.5 F (36.4 C)  TempSrc: Temporal  SpO2: 98%  Weight: 192 lb 3.2 oz (87.2 kg)  Height: 5\' 11"  (1.803 m)  PainSc: 0-No pain   Body mass index is 26.81 kg/m. Focused abdominal exam: His surgical incision is healing nicely.  It is well approximated without any erythema, induration, or drainage present.  There is no hernia felt with Valsalva.  This is a 58 year old man who had a small umbilical hernia.  It was primarily repaired.  He is doing well since his operation.  He may resume all of his usual activities, with the caveat that he should not lift anything heavier than 10 pounds for a total of 6 weeks from the time of his operation.  We will see him on an as-needed basis.

## 2019-11-15 ENCOUNTER — Ambulatory Visit
Admission: EM | Admit: 2019-11-15 | Discharge: 2019-11-15 | Disposition: A | Discharge status: Home / Self Care | Payer: BC Managed Care – PPO | Attending: Family Medicine | Admitting: Family Medicine

## 2019-11-15 ENCOUNTER — Encounter: Payer: Self-pay | Admitting: Emergency Medicine

## 2019-11-15 ENCOUNTER — Other Ambulatory Visit: Payer: Self-pay

## 2019-11-15 DIAGNOSIS — Z20822 Contact with and (suspected) exposure to covid-19: Secondary | ICD-10-CM

## 2019-11-15 DIAGNOSIS — Z20828 Contact with and (suspected) exposure to other viral communicable diseases: Secondary | ICD-10-CM | POA: Diagnosis not present

## 2019-11-15 NOTE — ED Triage Notes (Signed)
Pt was exposed to covid by his sister in law. No symptoms.

## 2019-11-15 NOTE — ED Provider Notes (Signed)
[facility] ____________________________________________  Time seen: Approximately 5:36 PM  I have reviewed the triage vital signs and the nursing notes.   HISTORY  Chief Complaint covid testing   HPI Jack Holland is a 58 y.o. male presenting for COVID-19 testing.  Patient reports he was exposed to his sister-in-law who received covid 19 test positive today, but reports he has been around a week.  Patient denies cough, chest pain, shortness of breath, sore throat, fevers, changes in taste or smell, vomiting or diarrhea.  States he feels well.  Denies symptoms.  Here for testing.   Past Medical History:  Diagnosis Date  . Allergy   . Colon polyp   . Headache    sinus  . Psoriasis     Patient Active Problem List   Diagnosis Date Noted  . Umbilical hernia without obstruction and without gangrene   . Encounter for screening colonoscopy   . Abdominal pain 01/17/2017  . Allergic rhinitis 01/17/2017  . Diverticulitis of colon 01/17/2017  . History of methicillin resistant Staphylococcus aureus infection 01/17/2017  . Psoriasis 01/17/2017    Past Surgical History:  Procedure Laterality Date  . COLONOSCOPY  2010?  . COLONOSCOPY WITH PROPOFOL N/A 06/17/2019   Procedure: COLONOSCOPY WITH PROPOFOL;  Surgeon: [name], MD;  Location: [place];  Service: Endoscopy;  Laterality: N/A;  . UMBILICAL HERNIA REPAIR N/A 10/14/2019   Procedure: HERNIA REPAIR UMBILICAL ADULT;  Surgeon: Fredirick Maudlin, MD;  Location: [facility];  Service: General;  Laterality: N/A;     No current facility-administered medications for this encounter.   Current Outpatient Medications:  .  cetirizine (ZYRTEC) 10 MG tablet, Take 10 mg by mouth daily as needed for allergies., Disp: , Rfl:  .  fluticasone (FLONASE) 50 MCG/ACT nasal spray, Place 1-2 sprays into both nostrils daily as needed for allergies or rhinitis., Disp: , Rfl:   Allergies Erythromycin and Naproxen   Family History  Problem Relation Age of Onset  . Diabetes Mother   . Gout Father   . Hypertension Father     Social History Social History   Tobacco Use  . Smoking status: Former Smoker    Quit date: 12/19/1998    Years since quitting: 20.9  . Smokeless tobacco: Current User    Types: Chew  Substance Use Topics  . Alcohol use: Yes    Comment: rarely  . Drug use: No    Review of Systems Constitutional: No fever ENT: No sore throat. Cardiovascular: Denies chest pain. Respiratory: Denies shortness of breath. Gastrointestinal: No abdominal pain.  No nausea, no vomiting.  No diarrhea.   Genitourinary: Negative for dysuria. Musculoskeletal: Negative for back pain. Skin: Negative for rash.   ____________________________________________   PHYSICAL EXAM:  VITAL SIGNS: ED Triage Vitals  Enc Vitals Group     BP 11/15/19 1721 112/77     Pulse Rate 11/15/19 1721 81     Resp 11/15/19 1721 18     Temp 11/15/19 1721 98.4 F (36.9 C)     Temp Source 11/15/19 1721 Oral     SpO2 11/15/19 1721 100 %     Weight 11/15/19 1720 183 lb (83 kg)     Height 11/15/19 1720 5\' 11"  (1.803 m)     Head Circumference --      Peak Flow --      Pain Score 11/15/19 1719 0     Pain Loc --      Pain Edu? --  Excl. in GC? --     Constitutional: Alert and oriented. Well appearing and in no acute distress. Eyes: Conjunctivae are normal. ENT      Head: Normocephalic and atraumatic. Cardiovascular: Normal rate, regular rhythm. Grossly normal heart sounds. Good peripheral circulation. Respiratory: Normal respiratory effort without tachypnea nor retractions. Breath sounds are clear and equal bilaterally. No wheezes, rales, rhonchi. Musculoskeletal: Steady gait.  Neurologic:  Normal speech and language. Speech is normal. No gait instability.  Skin:  Skin is warm, dry and intact. No rash noted. Psychiatric: Mood and affect are normal. Speech and behavior are normal. Patient exhibits appropriate  insight and judgment   ___________________________________________   LABS (all labs ordered are listed, but only abnormal results are displayed)  Labs Reviewed  NOVEL CORONAVIRUS, NAA (HOSP ORDER, SEND-OUT TO REF LAB; TAT 18-24 HRS)   PROCEDURES Procedures   INITIAL IMPRESSION / ASSESSMENT AND PLAN / ED COURSE  Pertinent labs & imaging results that were available during my care of the patient were reviewed by me and considered in my medical decision making (see chart for details).  Well-appearing patient.  Denies complaints.  Presenting for COVID-19 testing.  COVID-19 testing completed.  Monitor.  Discussed follow up and return parameters including no resolution or any worsening concerns. Patient verbalized understanding and agreed to plan.   ____________________________________________   FINAL CLINICAL IMPRESSION(S) / ED DIAGNOSES  Final diagnoses:  Exposure to COVID-19 virus     ED Discharge Orders    None       Note: This dictation was prepared with Dragon dictation along with smaller phrase technology. Any transcriptional errors that result from this process are unintentional.         Renford Dills, NP 11/15/19 1950

## 2019-11-16 LAB — NOVEL CORONAVIRUS, NAA (HOSP ORDER, SEND-OUT TO REF LAB; TAT 18-24 HRS): SARS-CoV-2, NAA: NOT DETECTED

## 2020-02-13 DIAGNOSIS — Z79899 Other long term (current) drug therapy: Secondary | ICD-10-CM | POA: Diagnosis not present

## 2020-02-13 DIAGNOSIS — L409 Psoriasis, unspecified: Secondary | ICD-10-CM | POA: Diagnosis not present

## 2020-10-20 ENCOUNTER — Ambulatory Visit (INDEPENDENT_AMBULATORY_CARE_PROVIDER_SITE_OTHER): Payer: BC Managed Care – PPO | Admitting: Family Medicine

## 2020-10-20 ENCOUNTER — Other Ambulatory Visit: Payer: Self-pay

## 2020-10-20 ENCOUNTER — Encounter: Payer: Self-pay | Admitting: Family Medicine

## 2020-10-20 VITALS — BP 104/72 | HR 74 | Temp 99.0°F | Resp 16 | Ht 71.0 in | Wt 187.0 lb

## 2020-10-20 DIAGNOSIS — Z0000 Encounter for general adult medical examination without abnormal findings: Secondary | ICD-10-CM

## 2020-10-20 DIAGNOSIS — L409 Psoriasis, unspecified: Secondary | ICD-10-CM | POA: Diagnosis not present

## 2020-10-20 DIAGNOSIS — R195 Other fecal abnormalities: Secondary | ICD-10-CM | POA: Diagnosis not present

## 2020-10-20 DIAGNOSIS — R39198 Other difficulties with micturition: Secondary | ICD-10-CM | POA: Diagnosis not present

## 2020-10-20 NOTE — Progress Notes (Signed)
Annual Wellness Visit     Patient: Jack Holland, Male    DOB: 1961/10/11, 59 y.o.   MRN: [id number] Visit Date: 10/20/2020  Today's Provider: Dortha Kern, PA   No chief complaint on file.  Subjective    Jack Holland is a 59 y.o. male who presents today for his Annual Wellness Visit. He reports consuming a general diet. The patient has a physically strenuous job, but has no regular exercise apart from work.  He generally feels well. He reports sleeping fairly well. He does not have additional problems to discuss today.    Past Medical History:  Diagnosis Date  . Allergy   . Colon polyp   . Headache    sinus  . Psoriasis    Past Surgical History:  Procedure Laterality Date  . COLONOSCOPY  2010?  . COLONOSCOPY WITH PROPOFOL N/A 06/17/2019   Procedure: COLONOSCOPY WITH PROPOFOL;  Surgeon: Midge Minium, MD;  Location: [facility];  Service: Endoscopy;  Laterality: N/A;  . UMBILICAL HERNIA REPAIR N/A 10/14/2019   Procedure: HERNIA REPAIR UMBILICAL ADULT;  Surgeon: Duanne Guess, MD;  Location: [facility];  Service: General;  Laterality: N/A;   Social History   Tobacco Use  . Smoking status: Former Smoker    Quit date: 12/19/1998    Years since quitting: 21.8  . Smokeless tobacco: Current User    Types: Chew  Vaping Use  . Vaping Use: Never used  Substance Use Topics  . Alcohol use: Yes    Comment: rarely  . Drug use: No   Family Status  Relation Name Status  . Mother  Alive  . Father  Alive  . Brother  Alive  . Daughter  Alive  . Son  Alive   Allergies  Allergen Reactions  . Erythromycin Rash  . Naproxen Hives   Medications: Outpatient Medications Prior to Visit  Medication Sig  . cetirizine (ZYRTEC) 10 MG tablet Take 10 mg by mouth daily as needed for allergies.  . fluticasone (FLONASE) 50 MCG/ACT nasal spray Place 1-2 sprays into both nostrils daily as needed for allergies or rhinitis.   No facility-administered medications  prior to visit.    Allergies  Allergen Reactions  . Erythromycin Rash  . Naproxen Hives    Patient Care Team: Laqueshia Cihlar, Jodell Cipro, PA as PCP - General (Family Medicine)  Review of Systems  Constitutional: Negative.   HENT: Negative.   Eyes: Negative.   Respiratory: Negative.   Cardiovascular: Negative.   Gastrointestinal: Negative.   Endocrine: Negative.   Genitourinary: Positive for decreased urine volume.  Musculoskeletal: Negative.   Skin: Positive for rash.       Long history of psoriasis.      Objective    Vitals: BP 104/72   Pulse 74   Temp 99 F (37.2 C) (Oral)   Resp 16   Ht 5\' 11"  (1.803 m)   Wt 187 lb (84.8 kg)   SpO2 99%   BMI 26.08 kg/m  BP Readings from Last 3 Encounters:  10/20/20 104/72  11/15/19 112/77  11/07/19 119/82   Wt Readings from Last 3 Encounters:  10/20/20 187 lb (84.8 kg)  11/15/19 183 lb (83 kg)  11/07/19 192 lb 3.2 oz (87.2 kg)     Physical Exam Constitutional:      Appearance: He is well-developed.  HENT:     Head: Normocephalic and atraumatic.     Right Ear: External ear normal.     Left Ear:  External ear normal.     Nose: Nose normal.  Eyes:     General:        Right eye: No discharge.     Conjunctiva/sclera: Conjunctivae normal.     Pupils: Pupils are equal, round, and reactive to light.  Neck:     Thyroid: No thyromegaly.     Trachea: No tracheal deviation.  Cardiovascular:     Rate and Rhythm: Normal rate and regular rhythm.     Heart sounds: Normal heart sounds. No murmur heard.   Pulmonary:     Effort: Pulmonary effort is normal. No respiratory distress.     Breath sounds: Normal breath sounds. No wheezing or rales.  Chest:     Chest wall: No tenderness.  Abdominal:     General: There is no distension.     Palpations: Abdomen is soft. There is no mass.     Tenderness: There is no abdominal tenderness. There is no guarding or rebound.  Genitourinary:    Penis: Normal.      Testes: Normal.     Prostate:  Normal.     Rectum: Normal. Guaiac result positive.  Musculoskeletal:        General: No tenderness. Normal range of motion.     Cervical back: Normal range of motion and neck supple.  Lymphadenopathy:     Cervical: No cervical adenopathy.  Skin:    General: Skin is warm and dry.     Findings: No erythema or rash.  Neurological:     Mental Status: He is alert and oriented to person, place, and time.     Cranial Nerves: No cranial nerve deficit.     Motor: No abnormal muscle tone.     Coordination: Coordination normal.     Deep Tendon Reflexes: Reflexes are normal and symmetric. Reflexes normal.  Psychiatric:        Behavior: Behavior normal.        Thought Content: Thought content normal.        Judgment: Judgment normal.      Most recent functional status assessment: In your present state of health, do you have any difficulty performing the following activities: 10/20/2020  Hearing? N  Vision? N  Difficulty concentrating or making decisions? N  Walking or climbing stairs? N  Dressing or bathing? N  Doing errands, shopping? N  Some recent data might be hidden   Most recent fall risk assessment: Fall Risk  10/20/2020  Falls in the past year? 0  Number falls in past yr: 0  Injury with Fall? 0  Follow up Falls evaluation completed    Most recent depression screenings: PHQ 2/9 Scores 10/20/2020 01/17/2017  PHQ - 2 Score 0 0   Most recent cognitive screening: No flowsheet data found. Most recent Audit-C alcohol use screening No flowsheet data found. A score of 3 or more in women, and 4 or more in men indicates increased risk for alcohol abuse, EXCEPT if all of the points are from question 1   No results found for any visits on 10/20/20.  Assessment & Plan     Annual wellness visit done today including the all of the following: Reviewed patient's Family Medical History Reviewed and updated list of patient's medical providers Assessment of cognitive impairment was  done Assessed patient's functional ability Established a written schedule for health screening services Health Risk Assessent Completed and Reviewed  Exercise Activities and Dietary recommendations Goals   Physically strenuous job and walk a lot each  day. No regular exercise program after work.     Immunization History  Administered Date(s) Administered  . Influenza,inj,Quad PF,6+ Mos 12/14/2017, 10/03/2020  . Tdap 11/03/2011    Health Maintenance  Topic Date Due  . Hepatitis C Screening  Never done  . COVID-19 Vaccine (1) Never done  . HIV Screening  Never done  . TETANUS/TDAP  11/02/2021  . COLONOSCOPY  06/16/2029  . INFLUENZA VACCINE  Completed     Discussed health benefits of physical activity, and encouraged him to engage in regular exercise appropriate for his age and condition.    1. Annual physical exam General health in good shape. Immunizations up to date. Given anticipatory counseling. Recheck routine labs. - CBC with Differential/Platelet - Comprehensive metabolic panel - Lipid panel - PSA - TSH  2. Psoriasis Lesions on both buttocks, elbows and knees. Clobetasol helps decrease redness and scaling. Recheck labs. - CBC with Differential/Platelet - Comprehensive metabolic panel  3. Occult blood in stools History of internal hemorrhoids with diverticulosis per colonoscopy 06-17-19 by Dr. Servando Snare (GI). Check CBC. Hemoccult slide positive today. - CBC with Differential/Platelet  4. Decreased urine stream No nocturia or UTI's. States father had history of prostate disease and sepsis from outlet obstruction. Will check renal function and PSA. DRE unremarkable today. - PSA   No follow-ups on file.     Haywood Pao, PA, have reviewed all documentation for this visit. The documentation on 10/20/20 for the exam, diagnosis, procedures, and orders are all accurate and complete.    Dortha Kern, PA  [facility] [phone]  (phone) [phone] (fax)  [facility]

## 2020-10-23 DIAGNOSIS — L409 Psoriasis, unspecified: Secondary | ICD-10-CM | POA: Diagnosis not present

## 2020-10-23 DIAGNOSIS — R39198 Other difficulties with micturition: Secondary | ICD-10-CM | POA: Diagnosis not present

## 2020-10-23 DIAGNOSIS — Z0000 Encounter for general adult medical examination without abnormal findings: Secondary | ICD-10-CM | POA: Diagnosis not present

## 2020-10-23 DIAGNOSIS — R195 Other fecal abnormalities: Secondary | ICD-10-CM | POA: Diagnosis not present

## 2020-10-24 LAB — PSA: Prostate Specific Ag, Serum: 1.6 ng/mL (ref 0.0–4.0)

## 2020-10-24 LAB — COMPREHENSIVE METABOLIC PANEL
ALT: 17 IU/L (ref 0–44)
AST: 24 IU/L (ref 0–40)
Albumin/Globulin Ratio: 2.4 — ABNORMAL HIGH (ref 1.2–2.2)
Albumin: 4.7 g/dL (ref 3.8–4.9)
Alkaline Phosphatase: 76 IU/L (ref 44–121)
BUN/Creatinine Ratio: 26 — ABNORMAL HIGH (ref 9–20)
BUN: 21 mg/dL (ref 6–24)
Bilirubin Total: 0.5 mg/dL (ref 0.0–1.2)
CO2: 23 mmol/L (ref 20–29)
Calcium: 9.7 mg/dL (ref 8.7–10.2)
Chloride: 102 mmol/L (ref 96–106)
Creatinine, Ser: 0.81 mg/dL (ref 0.76–1.27)
GFR calc Af Amer: 113 mL/min/{1.73_m2} (ref >59)
GFR calc non Af Amer: 98 mL/min/{1.73_m2} (ref >59)
Globulin, Total: 2 g/dL (ref 1.5–4.5)
Glucose: 75 mg/dL (ref 65–99)
Potassium: 4.2 mmol/L (ref 3.5–5.2)
Sodium: 142 mmol/L (ref 134–144)
Total Protein: 6.7 g/dL (ref 6.0–8.5)

## 2020-10-24 LAB — CBC WITH DIFFERENTIAL/PLATELET
Basophils Absolute: 0 10*3/uL (ref 0.0–0.2)
Basos: 1 %
EOS (ABSOLUTE): 0 10*3/uL (ref 0.0–0.4)
Eos: 1 %
Hematocrit: 42.3 % (ref 37.5–51.0)
Hemoglobin: 14.4 g/dL (ref 13.0–17.7)
Immature Grans (Abs): 0 10*3/uL (ref 0.0–0.1)
Immature Granulocytes: 0 %
Lymphocytes Absolute: 1.2 10*3/uL (ref 0.7–3.1)
Lymphs: 19 %
MCH: 29.6 pg (ref 26.6–33.0)
MCHC: 34 g/dL (ref 31.5–35.7)
MCV: 87 fL (ref 79–97)
Monocytes Absolute: 0.5 10*3/uL (ref 0.1–0.9)
Monocytes: 8 %
Neutrophils Absolute: 4.6 10*3/uL (ref 1.4–7.0)
Neutrophils: 71 %
Platelets: 281 10*3/uL (ref 150–450)
RBC: 4.86 x10E6/uL (ref 4.14–5.80)
RDW: 13 % (ref 11.6–15.4)
WBC: 6.4 10*3/uL (ref 3.4–10.8)

## 2020-10-24 LAB — TSH: TSH: 1.1 u[IU]/mL (ref 0.450–4.500)

## 2020-10-24 LAB — LIPID PANEL
Chol/HDL Ratio: 4.4 ratio (ref 0.0–5.0)
Cholesterol, Total: 180 mg/dL (ref 100–199)
HDL: 41 mg/dL (ref >39)
LDL Chol Calc (NIH): 127 mg/dL — ABNORMAL HIGH (ref 0–99)
Triglycerides: 65 mg/dL (ref 0–149)
VLDL Cholesterol Cal: 12 mg/dL (ref 5–40)

## 2020-10-30 ENCOUNTER — Other Ambulatory Visit: Payer: Self-pay

## 2020-10-30 ENCOUNTER — Ambulatory Visit
Admission: RE | Admit: 2020-10-30 | Discharge: 2020-10-30 | Disposition: A | Discharge status: Home / Self Care | Payer: BC Managed Care – PPO | Source: Ambulatory Visit | Attending: Family Medicine | Admitting: Family Medicine

## 2020-10-30 ENCOUNTER — Ambulatory Visit (INDEPENDENT_AMBULATORY_CARE_PROVIDER_SITE_OTHER): Payer: BC Managed Care – PPO | Admitting: Family Medicine

## 2020-10-30 ENCOUNTER — Ambulatory Visit
Admission: RE | Admit: 2020-10-30 | Discharge: 2020-10-30 | Disposition: A | Discharge status: Home / Self Care | Payer: BC Managed Care – PPO | Attending: Family Medicine | Admitting: Family Medicine

## 2020-10-30 ENCOUNTER — Encounter: Payer: Self-pay | Admitting: Family Medicine

## 2020-10-30 VITALS — BP 102/82 | HR 73 | Temp 98.3°F | Resp 16 | Ht 71.0 in | Wt 184.0 lb

## 2020-10-30 DIAGNOSIS — M722 Plantar fascial fibromatosis: Secondary | ICD-10-CM | POA: Insufficient documentation

## 2020-10-30 DIAGNOSIS — M79671 Pain in right foot: Secondary | ICD-10-CM | POA: Diagnosis not present

## 2020-10-30 MED ORDER — MELOXICAM 15 MG PO TABS: 15.0000 mg | ORAL_TABLET | Freq: Every day | ORAL | 0 refills | Status: DC

## 2020-10-30 NOTE — Progress Notes (Signed)
I,April Miller,acting as a Neurosurgeon for [employer].,have documented all relevant documentation on the behalf of [employer],as directed by  [employer] while in the presence of [employer], [place].   Established patient visit   Patient: Jack Holland   DOB: Feb 04, 1961   59 y.o. Male  MRN: [id number] Visit Date: 10/30/2020  Today's healthcare provider: Dortha Kern, PA   Chief Complaint  Patient presents with  . Foot Pain   Subjective    HPI  Patient is here concerning right foot pain. Patient states both feet started hurting 3 weeks ago. The left foot has since stopped hurting but the right foot continues. Patient states the ball and heels of his foot feel like a stone bruise. Patient states his foot is also painful when you press on those areas.patient states he walks all day at work. By the end of the day he is limping. Patient has been treating symptoms with ice and ibuprofen with mild relief.   Past Medical History:  Diagnosis Date  . Allergy   . Colon polyp   . Headache    sinus  . Psoriasis    Past Surgical History:  Procedure Laterality Date  . COLONOSCOPY  2010?  . COLONOSCOPY WITH PROPOFOL N/A 06/17/2019   Procedure: COLONOSCOPY WITH PROPOFOL;  Surgeon: Midge Minium, MD;  Location: [facility];  Service: Endoscopy;  Laterality: N/A;  . UMBILICAL HERNIA REPAIR N/A 10/14/2019   Procedure: HERNIA REPAIR UMBILICAL ADULT;  Surgeon: Duanne Guess, MD;  Location: [facility];  Service: General;  Laterality: N/A;   Social History   Tobacco Use  . Smoking status: Former Smoker    Quit date: 12/19/1998    Years since quitting: 21.8  . Smokeless tobacco: Current User    Types: Chew  Vaping Use  . Vaping Use: Never used  Substance Use Topics  . Alcohol use: Yes    Comment: rarely  . Drug use: No   Family History  Problem Relation Age of Onset  . Diabetes Mother   . Gout Father   . Hypertension Father    Allergies  Allergen  Reactions  . Erythromycin Rash  . Naproxen Hives       Medications: Outpatient Medications Prior to Visit  Medication Sig  . cetirizine (ZYRTEC) 10 MG tablet Take 10 mg by mouth daily as needed for allergies.  . fluticasone (FLONASE) 50 MCG/ACT nasal spray Place 1-2 sprays into both nostrils daily as needed for allergies or rhinitis.   No facility-administered medications prior to visit.    Review of Systems  Constitutional: Negative for appetite change, chills and fever.  Respiratory: Negative for chest tightness, shortness of breath and wheezing.   Cardiovascular: Negative for chest pain and palpitations.  Gastrointestinal: Negative for abdominal pain, nausea and vomiting.      Objective    BP 102/82 (BP Location: Right Arm, Patient Position: Sitting, Cuff Size: Large)   Pulse 73   Temp 98.3 F (36.8 C) (Oral)   Resp 16   Ht 5\' 11"  (1.803 m)   Wt 184 lb (83.5 kg)   SpO2 100%   BMI 25.66 kg/m    Physical Exam Constitutional:      General: He is not in acute distress.    Appearance: He is well-developed.  HENT:     Head: Normocephalic and atraumatic.     Right Ear: Hearing normal.     Left Ear: Hearing normal.     Nose: Nose  normal.  Eyes:     General: Lids are normal. No scleral icterus.       Right eye: No discharge.        Left eye: No discharge.     Conjunctiva/sclera: Conjunctivae normal.  Pulmonary:     Effort: Pulmonary effort is normal. No respiratory distress.  Musculoskeletal:        General: Tenderness present. Normal range of motion.     Comments: Tender along the anterior right heel plantar surface to palpate. No swelling or bruising.  Skin:    Findings: No lesion or rash.  Neurological:     Mental Status: He is alert and oriented to person, place, and time.  Psychiatric:        Speech: Speech normal.        Behavior: Behavior normal.        Thought Content: Thought content normal.      No results found for any visits on 10/30/20.   Assessment & Plan     1. Plantar fasciitis of right foot Developed pain in the right heel area the past 3 weeks without known injury. Pain worse with climbing ladders and standing on the runs for a long period of time. Recommend NSAID, heel cups with arch supports and ice massage each evening. Should limit time on ladders to take the stress off the plantar fascia. Will get x-Maxim to evaluate for bony abnormality. Recheck in 2 weeks. Completed FMLA forms for work. - DG Foot Complete Right - meloxicam (MOBIC) 15 MG tablet; Take 1 tablet (15 mg total) by mouth daily.  Dispense: 30 tablet; Refill: 0   No follow-ups on file.      Haywood Pao, PA, have reviewed all documentation for this visit. The documentation on 10/30/20 for the exam, diagnosis, procedures, and orders are all accurate and complete.    Dortha Kern, PA  [facility] [phone] (phone) [phone] (fax)  [facility]

## 2020-10-30 NOTE — Patient Instructions (Signed)
Plantar Fasciitis  Plantar fasciitis is a painful foot condition that affects the heel. It occurs when the band of tissue that connects the toes to the heel bone (plantar fascia) becomes irritated. This can happen as the result of exercising too much or doing other repetitive activities (overuse injury). The pain from plantar fasciitis can range from mild irritation to severe pain that makes it difficult to walk or move. The pain is usually worse in the morning after sleeping, or after sitting or lying down for a while. Pain may also be worse after long periods of walking or standing. What are the causes? This condition may be caused by:  Standing for long periods of time.  Wearing shoes that do not have good arch support.  Doing activities that put stress on joints (high-impact activities), including running, aerobics, and ballet.  Being overweight.  An abnormal way of walking (gait).  Tight muscles in the back of your lower leg (calf).  High arches in your feet.  Starting a new athletic activity. What are the signs or symptoms? The main symptom of this condition is heel pain. Pain may:  Be worse with first steps after a time of rest, especially in the morning after sleeping or after you have been sitting or lying down for a while.  Be worse after long periods of standing still.  Decrease after 30-45 minutes of activity, such as gentle walking. How is this diagnosed? This condition may be diagnosed based on your medical history and your symptoms. Your health care provider may ask questions about your activity level. Your health care provider will do a physical exam to check for:  A tender area on the bottom of your foot.  A high arch in your foot.  Pain when you move your foot.  Difficulty moving your foot. You may have imaging tests to confirm the diagnosis, such as:  X-rays.  Ultrasound.  MRI. How is this treated? Treatment for plantar fasciitis depends on how  severe your condition is. Treatment may include:  Rest, ice, applying pressure (compression), and raising the affected foot (elevation). This may be called RICE therapy. Your health care provider may recommend RICE therapy along with over-the-counter pain medicines to manage your pain.  Exercises to stretch your calves and your plantar fascia.  A splint that holds your foot in a stretched, upward position while you sleep (night splint).  Physical therapy to relieve symptoms and prevent problems in the future.  Injections of steroid medicine (cortisone) to relieve pain and inflammation.  Stimulating your plantar fascia with electrical impulses (extracorporeal shock wave therapy). This is usually the last treatment option before surgery.  Surgery, if other treatments have not worked after 12 months. Follow these instructions at home:  Managing pain, stiffness, and swelling  If directed, put ice on the painful area: ? Put ice in a plastic bag, or use a frozen bottle of water. ? Place a towel between your skin and the bag or bottle. ? Roll the bottom of your foot over the bag or bottle. ? Do this for 20 minutes, 2-3 times a day.  Wear athletic shoes that have air-sole or gel-sole cushions, or try wearing soft shoe inserts that are designed for plantar fasciitis.  Raise (elevate) your foot above the level of your heart while you are sitting or lying down. Activity  Avoid activities that cause pain. Ask your health care provider what activities are safe for you.  Do physical therapy exercises and stretches as told   by your health care provider.  Try activities and forms of exercise that are easier on your joints (low-impact). Examples include swimming, water aerobics, and biking. General instructions  Take over-the-counter and prescription medicines only as told by your health care provider.  Wear a night splint while sleeping, if told by your health care provider. Loosen the splint  if your toes tingle, become numb, or turn cold and blue.  Maintain a healthy weight, or work with your health care provider to lose weight as needed.  Keep all follow-up visits as told by your health care provider. This is important. Contact a health care provider if you:  Have symptoms that do not go away after caring for yourself at home.  Have pain that gets worse.  Have pain that affects your ability to move or do your daily activities. Summary  Plantar fasciitis is a painful foot condition that affects the heel. It occurs when the band of tissue that connects the toes to the heel bone (plantar fascia) becomes irritated.  The main symptom of this condition is heel pain that may be worse after exercising too much or standing still for a long time.  Treatment varies, but it usually starts with rest, ice, compression, and elevation (RICE therapy) and over-the-counter medicines to manage pain. This information is not intended to replace advice given to you by your health care provider. Make sure you discuss any questions you have with your health care provider. Document Revised: 11/17/2017 Document Reviewed: 10/02/2017 Elsevier Patient Education  2020 [employer]  

## 2021-05-11 ENCOUNTER — Telehealth: Payer: Self-pay

## 2021-05-11 NOTE — Telephone Encounter (Signed)
Patient reports not on any injectable treatment for his psoriasis.  States that his symptoms are mild and he feels he will be able to get better without the treatment.

## 2021-05-11 NOTE — Telephone Encounter (Signed)
Low risk for complications if symptoms are mild, unless he has been started on immunosuppressive treatment for psoriasis.

## 2021-05-11 NOTE — Telephone Encounter (Signed)
Copied from CRM [id number]. Topic: Appointment Scheduling - Scheduling Inquiry for Clinic >> May 11, 2021  9:48 AM Leafy Ro wrote: Reason for CRM: Pt wife is calling and the pt test positive yesterday with in home covid test and would like to see about getting covid treatment. Pt would like virtual appt today

## 2021-09-27 ENCOUNTER — Encounter: Payer: Self-pay | Admitting: General Surgery

## 2022-03-25 ENCOUNTER — Ambulatory Visit (INDEPENDENT_AMBULATORY_CARE_PROVIDER_SITE_OTHER): Payer: BC Managed Care – PPO | Admitting: Family Medicine

## 2022-03-25 ENCOUNTER — Encounter: Payer: Self-pay | Admitting: Family Medicine

## 2022-03-25 ENCOUNTER — Ambulatory Visit: Payer: BC Managed Care – PPO | Admitting: Family Medicine

## 2022-03-25 VITALS — BP 122/78 | HR 73 | Temp 97.9°F | Resp 16 | Wt 188.1 lb

## 2022-03-25 DIAGNOSIS — M546 Pain in thoracic spine: Secondary | ICD-10-CM | POA: Diagnosis not present

## 2022-03-25 DIAGNOSIS — G8929 Other chronic pain: Secondary | ICD-10-CM | POA: Insufficient documentation

## 2022-03-25 MED ORDER — CYCLOBENZAPRINE HCL 5 MG PO TABS: 5.0000 mg | ORAL_TABLET | Freq: Three times a day (TID) | ORAL | 1 refills | Status: DC | PRN

## 2022-03-25 MED ORDER — MELOXICAM 15 MG PO TABS: 15.0000 mg | ORAL_TABLET | Freq: Every day | ORAL | 0 refills | Status: DC

## 2022-03-25 NOTE — Progress Notes (Signed)
?  ?[employer] as a Education administrator for Jack Sprout, FNP.,have documented all relevant documentation on the behalf of Jack Sprout, FNP,as directed by  Jack Sprout, FNP while in the presence of Jack Sprout, FNP.  ?[facility] ? ?Established patient visit ? ?Patient: Jack Holland   DOB: 12-03-1961   61 y.o. Male  MRN: [id number] ?Visit Date: 03/25/2022 ? ?Today's healthcare provider: [name], FNP  ? ?Introduced to Designer, jewellery role and practice setting.  All questions answered.  Discussed provider/patient relationship and expectations. ? ? ?Chief Complaint  ?Patient presents with  ? Back Pain  ? ?Subjective  ?  ?Back Pain ?This is a new problem. The current episode started more than 1 month ago (patient reports since December 2022). The problem has been waxing and waning since onset. The pain is present in the lumbar spine. The pain does not radiate. The symptoms are aggravated by bending. Pertinent negatives include no abdominal pain, bladder incontinence, bowel incontinence, chest pain, dysuria, fever, headaches, leg pain, numbness, paresis, paresthesias, pelvic pain, perianal numbness, tingling, weakness or weight loss. Treatments tried: Ibuprofen and Tylenol. The treatment provided no relief.   ? ?Medications: ?Outpatient Medications Prior to Visit  ?Medication Sig  ? cetirizine (ZYRTEC) 10 MG tablet Take 10 mg by mouth daily as needed for allergies.  ? fluticasone (FLONASE) 50 MCG/ACT nasal spray Place 1-2 sprays into both nostrils daily as needed for allergies or rhinitis.  ? [DISCONTINUED] meloxicam (MOBIC) 15 MG tablet Take 1 tablet (15 mg total) by mouth daily.  ? ?No facility-administered medications prior to visit.  ? ? ?Review of Systems  ?Constitutional:  Negative for fever and weight loss.  ?Cardiovascular:  Negative for chest pain.  ?Gastrointestinal:  Negative for abdominal pain and bowel incontinence.  ?Genitourinary:  Negative for bladder incontinence,  dysuria and pelvic pain.  ?Musculoskeletal:  Positive for back pain.  ?Neurological:  Negative for tingling, weakness, numbness, headaches and paresthesias.  ? ? ?  Objective  ?  ?BP 122/78   Pulse 73   Temp 97.9 ?F (36.6 ?C) (Temporal)   Resp 16   Wt 188 lb 1.6 oz (85.3 kg)   BMI 26.23 kg/m?  ? ? ?Physical Exam ?Vitals and nursing note reviewed.  ?Constitutional:   ?   Appearance: Normal appearance. He is overweight.  ?HENT:  ?   Head: Normocephalic and atraumatic.  ?Eyes:  ?   Pupils: Pupils are equal, round, and reactive to light.  ?Cardiovascular:  ?   Rate and Rhythm: Normal rate and regular rhythm.  ?   Pulses: Normal pulses.  ?   Heart sounds: Normal heart sounds.  ?Pulmonary:  ?   Effort: Pulmonary effort is normal.  ?   Breath sounds: Normal breath sounds.  ?Musculoskeletal:     ?   General: Tenderness present. Normal range of motion.  ?   Cervical back: Normal range of motion.  ?   Comments: Negative SLR; slight hip tenderness  ?Skin: ?   General: Skin is warm and dry.  ?   Capillary Refill: Capillary refill takes less than 2 seconds.  ?Neurological:  ?   General: No focal deficit present.  ?   Mental Status: He is alert and oriented to person, place, and time. Mental status is at baseline.  ?Psychiatric:     ?   Mood and Affect: Mood normal.     ?   Behavior: Behavior normal.     ?  Thought Content: Thought content normal.     ?   Judgment: Judgment normal.  ?  ? ?No results found for any visits on 03/25/22. ? Assessment & Plan  ?  ? ?Problem List Items Addressed This Visit   ? ?  ? Other  ? Chronic bilateral thoracic back pain - Primary  ?  Chronic, unknown cause ?Recommend use of Mobic x 1 week and then X-rays to ensure adequate inflammation relief ?Recommend use of flexeril to assist ?X-rays ordered of thoracic and lumbar since unknown origin ?Negative SLR ?Slight tension in hips ?  ?  ? Relevant Medications  ? meloxicam (MOBIC) 15 MG tablet  ? cyclobenzaprine (FLEXERIL) 5 MG tablet  ? Other  Relevant Orders  ? DG Lumbar Spine Complete  ? DG Thoracic Spine W/Swimmers  ? ?Return in about 4 weeks (around 04/22/2022) for annual examination.  ?   ?I, Jack Sprout, FNP, have reviewed all documentation for this visit. The documentation on 03/25/22 for the exam, diagnosis, procedures, and orders are all accurate and complete. ? ?Jack Sprout, FNP  ?[place] ?[phone] (phone) ?[phone] (fax) ? ?[facility] ?

## 2022-03-25 NOTE — Assessment & Plan Note (Signed)
Chronic, unknown cause ?Recommend use of Mobic x 1 week and then X-rays to ensure adequate inflammation relief ?Recommend use of flexeril to assist ?X-rays ordered of thoracic and lumbar since unknown origin ?Negative SLR ?Slight tension in hips ?

## 2022-03-29 ENCOUNTER — Ambulatory Visit
Admission: RE | Admit: 2022-03-29 | Discharge: 2022-03-29 | Disposition: A | Discharge status: Home / Self Care | Payer: BC Managed Care – PPO | Source: Ambulatory Visit | Attending: Family Medicine | Admitting: Family Medicine

## 2022-03-29 ENCOUNTER — Ambulatory Visit
Admission: RE | Admit: 2022-03-29 | Discharge: 2022-03-29 | Disposition: A | Discharge status: Home / Self Care | Payer: BC Managed Care – PPO | Attending: Family Medicine | Admitting: Family Medicine

## 2022-03-29 DIAGNOSIS — M549 Dorsalgia, unspecified: Secondary | ICD-10-CM | POA: Diagnosis not present

## 2022-03-29 DIAGNOSIS — M545 Low back pain, unspecified: Secondary | ICD-10-CM | POA: Diagnosis not present

## 2022-03-29 DIAGNOSIS — G8929 Other chronic pain: Secondary | ICD-10-CM | POA: Diagnosis not present

## 2022-03-29 DIAGNOSIS — M546 Pain in thoracic spine: Secondary | ICD-10-CM | POA: Insufficient documentation

## 2022-04-29 ENCOUNTER — Ambulatory Visit (INDEPENDENT_AMBULATORY_CARE_PROVIDER_SITE_OTHER): Payer: BC Managed Care – PPO | Admitting: Family Medicine

## 2022-04-29 ENCOUNTER — Encounter: Payer: Self-pay | Admitting: Family Medicine

## 2022-04-29 VITALS — BP 121/85 | HR 79 | Temp 98.8°F | Resp 16 | Ht 71.0 in | Wt 184.0 lb

## 2022-04-29 DIAGNOSIS — F17220 Nicotine dependence, chewing tobacco, uncomplicated: Secondary | ICD-10-CM | POA: Diagnosis not present

## 2022-04-29 DIAGNOSIS — Z114 Encounter for screening for human immunodeficiency virus [HIV]: Secondary | ICD-10-CM | POA: Insufficient documentation

## 2022-04-29 DIAGNOSIS — E7800 Pure hypercholesterolemia, unspecified: Secondary | ICD-10-CM | POA: Insufficient documentation

## 2022-04-29 DIAGNOSIS — L409 Psoriasis, unspecified: Secondary | ICD-10-CM | POA: Diagnosis not present

## 2022-04-29 DIAGNOSIS — Z125 Encounter for screening for malignant neoplasm of prostate: Secondary | ICD-10-CM | POA: Diagnosis not present

## 2022-04-29 DIAGNOSIS — Z1159 Encounter for screening for other viral diseases: Secondary | ICD-10-CM | POA: Insufficient documentation

## 2022-04-29 DIAGNOSIS — Z23 Encounter for immunization: Secondary | ICD-10-CM | POA: Diagnosis not present

## 2022-04-29 DIAGNOSIS — Z0000 Encounter for general adult medical examination without abnormal findings: Secondary | ICD-10-CM

## 2022-04-29 NOTE — Assessment & Plan Note (Signed)
Low risk screen Treatable, and curable. If left untreated Hep C can lead to cirrhosis and liver failure. Encourage routine testing; recommend repeat testing if risk factors change.  

## 2022-04-29 NOTE — Assessment & Plan Note (Signed)
Chronic, stable  ?Uses topical cream; does not wish to be on a biologic  ?

## 2022-04-29 NOTE — Progress Notes (Signed)
? ? ? ?Complete physical exam ? ? ?Patient: Jack Holland   DOB: [birth year]   61 y.o. Male  MRN: [id number] ?Visit Date: 04/29/2022 ? ?Today's healthcare provider: Jacky Kindle, FNP  ?Re Introduced to nurse practitioner role and practice setting.  All questions answered.  Discussed provider/patient relationship and expectations. ? ?[employer] as a Neurosurgeon for Jacky Kindle, FNP.,have documented all relevant documentation on the behalf of Jacky Kindle, FNP,as directed by  Jacky Kindle, FNP while in the presence of Jacky Kindle, FNP.  ?Chief Complaint  ?Patient presents with  ? Annual Exam  ? ?Subjective  ?  ?Jack Holland is a 61 y.o. male who presents today for a complete physical exam.  ?He reports consuming a general diet. The patient does not participate in regular exercise at present. He generally feels fairly well. He reports sleeping well. He does not have additional problems to discuss today.  ?HPI  ? ? ?Past Medical History:  ?Diagnosis Date  ? Allergy   ? Colon polyp   ? Headache   ? sinus  ? Psoriasis   ? ?Past Surgical History:  ?Procedure Laterality Date  ? COLONOSCOPY  2010?  ? COLONOSCOPY WITH PROPOFOL N/A 06/17/2019  ? Procedure: COLONOSCOPY WITH PROPOFOL;  Surgeon: Midge Minium, MD;  Location: [facility];  Service: Endoscopy;  Laterality: N/A;  ? UMBILICAL HERNIA REPAIR N/A 10/14/2019  ? Procedure: HERNIA REPAIR UMBILICAL ADULT;  Surgeon: Duanne Guess, MD;  Location: [facility];  Service: General;  Laterality: N/A;  ? ?Social History  ? ?Socioeconomic History  ? Marital status: Married  ?  Spouse name: Not on file  ? Number of children: 2  ? Years of education: Not on file  ? Highest education level: Not on file  ?Occupational History  ? Not on file  ?Tobacco Use  ? Smoking status: Former  ? Smokeless tobacco: Current  ?  Types: Chew  ?Vaping Use  ? Vaping Use: Never used  ?Substance and Sexual Activity  ? Alcohol use: Yes  ?  Comment: rarely  ? Drug use: No  ?  Sexual activity: Not on file  ?Other Topics Concern  ? Not on file  ?Social History Narrative  ? Not on file  ? ?Social Determinants of Health  ? ?Financial Resource Strain: Not on file  ?Food Insecurity: Not on file  ?Transportation Needs: Not on file  ?Physical Activity: Not on file  ?Stress: Not on file  ?Social Connections: Not on file  ?Intimate Partner Violence: Not on file  ? ?Family Status  ?Relation Name Status  ? Mother  Alive  ? Father  Alive  ? Brother  Alive  ? Daughter  Alive  ? Son  Alive  ? ?Family History  ?Problem Relation Age of Onset  ? Diabetes Mother   ? Gout Father   ? Hypertension Father   ? ?Allergies  ?Allergen Reactions  ? Erythromycin Rash  ? Naproxen Hives  ?  ?Patient Care Team: ?Jacky Kindle, FNP as PCP - General (Family Medicine)  ? ?Medications: ?Outpatient Medications Prior to Visit  ?Medication Sig  ? cetirizine (ZYRTEC) 10 MG tablet Take 10 mg by mouth daily as needed for allergies.  ? cyclobenzaprine (FLEXERIL) 5 MG tablet Take 1 tablet (5 mg total) by mouth 3 (three) times daily as needed for muscle spasms. Start with evening dose, on non-working day to see how you do with medication. Work up to 3x/day  ?  fluticasone (FLONASE) 50 MCG/ACT nasal spray Place 1-2 sprays into both nostrils daily as needed for allergies or rhinitis.  ? [DISCONTINUED] meloxicam (MOBIC) 15 MG tablet Take 1 tablet (15 mg total) by mouth daily.  ? ?No facility-administered medications prior to visit.  ? ? ?Review of Systems  ?All other systems reviewed and are negative. ? ? ? Objective  ? ?  ?BP 121/85   Pulse 79   Temp 98.8 ?F (37.1 ?C) (Oral)   Resp 16   Ht  (1.803 m)   Wt 184 lb (83.5 kg)   BMI 25.66 kg/m?  ? ? ? ?Physical Exam ?Vitals and nursing note reviewed.  ?Constitutional:   ?   General: He is awake. He is not in acute distress. ?   Appearance: Normal appearance. He is well-developed and well-groomed. He is not ill-appearing, toxic-appearing or diaphoretic.  ?HENT:  ?   Head:  Normocephalic and atraumatic.  ?   Jaw: There is normal jaw occlusion. No trismus, tenderness, swelling or pain on movement.  ?   Salivary Glands: Right salivary gland is not diffusely enlarged or tender. Left salivary gland is not diffusely enlarged or tender.  ?   Right Ear: Hearing, tympanic membrane, ear canal and external ear normal. There is no impacted cerumen.  ?   Left Ear: Hearing, tympanic membrane, ear canal and external ear normal. There is no impacted cerumen.  ?   Nose: Nose normal. No congestion or rhinorrhea.  ?   Right Turbinates: Not enlarged, swollen or pale.  ?   Left Turbinates: Not enlarged, swollen or pale.  ?   Right Sinus: No maxillary sinus tenderness or frontal sinus tenderness.  ?   Left Sinus: No maxillary sinus tenderness or frontal sinus tenderness.  ?   Mouth/Throat:  ?   Lips: Pink.  ?   Mouth: Mucous membranes are moist. No injury, lacerations, oral lesions or angioedema.  ?   Pharynx: Oropharynx is clear. Uvula midline. No pharyngeal swelling, oropharyngeal exudate or posterior oropharyngeal erythema.  ?   Tonsils: No tonsillar exudate or tonsillar abscesses.  ?Eyes:  ?   General: Lids are normal. Vision grossly intact. Gaze aligned appropriately.     ?   Right eye: No discharge.     ?   Left eye: No discharge.  ?   Extraocular Movements: Extraocular movements intact.  ?   Conjunctiva/sclera: Conjunctivae normal.  ?   Pupils: Pupils are equal, round, and reactive to light.  ?Neck:  ?   Thyroid: No thyroid mass, thyromegaly or thyroid tenderness.  ?   Vascular: No carotid bruit.  ?   Trachea: Trachea normal. No tracheal tenderness.  ?Cardiovascular:  ?   Rate and Rhythm: Normal rate and regular rhythm.  ?   Pulses: Normal pulses.     ?     Carotid pulses are 2+ on the right side and 2+ on the left side. ?     Radial pulses are 2+ on the right side and 2+ on the left side.  ?     Femoral pulses are 2+ on the right side and 2+ on the left side. ?     Popliteal pulses are 2+ on the  right side and 2+ on the left side.  ?     Dorsalis pedis pulses are 2+ on the right side and 2+ on the left side.  ?     Posterior tibial pulses are 2+ on the right side and 2+  on the left side.  ?   Heart sounds: Normal heart sounds, S1 normal and S2 normal. No murmur heard. ?  No friction rub. No gallop.  ?Pulmonary:  ?   Effort: Pulmonary effort is normal. No respiratory distress.  ?   Breath sounds: Normal breath sounds and air entry. No stridor. No wheezing, rhonchi or rales.  ?Chest:  ?   Chest wall: No tenderness.  ?Abdominal:  ?   General: Abdomen is flat. Bowel sounds are normal. There is no distension.  ?   Palpations: Abdomen is soft. There is no mass.  ?   Tenderness: There is no abdominal tenderness. There is no guarding or rebound.  ?   Hernia: No hernia is present.  ?Genitourinary: ?   Comments: Exam deferred; denies complaints ?Musculoskeletal:     ?   General: No swelling, tenderness, deformity or signs of injury. Normal range of motion.  ?   Cervical back: Normal range of motion and neck supple. No rigidity or tenderness.  ?   Right lower leg: No edema.  ?   Left lower leg: No edema.  ?Lymphadenopathy:  ?   Cervical: No cervical adenopathy.  ?   Right cervical: No superficial, deep or posterior cervical adenopathy. ?   Left cervical: No superficial, deep or posterior cervical adenopathy.  ?Skin: ?   General: Skin is warm and dry.  ?   Capillary Refill: Capillary refill takes less than 2 seconds.  ?   Coloration: Skin is not jaundiced or pale.  ?   Findings: No bruising, erythema, lesion or rash.  ?   Comments: Chronic psoriasis   ?Neurological:  ?   General: No focal deficit present.  ?   Mental Status: He is alert and oriented to person, place, and time. Mental status is at baseline.  ?   GCS: GCS eye subscore is 4. GCS verbal subscore is 5. GCS motor subscore is 6.  ?   Sensory: Sensation is intact. No sensory deficit.  ?   Motor: Motor function is intact. No weakness.  ?   Coordination:  Coordination is intact.  ?   Gait: Gait is intact.  ?Psychiatric:     ?   Attention and Perception: Attention and perception normal.     ?   Mood and Affect: Mood and affect normal.     ?   Speech: Speech normal.     ?

## 2022-04-29 NOTE — Assessment & Plan Note (Signed)
Denies LUTS; check PSA with CPE for prostate cancer screening ?

## 2022-04-29 NOTE — Assessment & Plan Note (Signed)
Chronic, stable ?Elevated LDL on labs ?recommend diet low in saturated fat and regular exercise - 30 min at least 5 times per week ?Not on statin; borderline ASCVD risk  ?The 10-year ASCVD risk score (Arnett DK, et al., 2019) is: 7.6% ?  Values used to calculate the score: ?    Age: 61 years ?    Sex: Male ?    Is Non-Hispanic African American: No ?    Diabetic: No ?    Tobacco smoker: No ?    Systolic Blood Pressure: 121 mmHg ?    Is BP treated: No ?    HDL Cholesterol: 41 mg/dL ?    Total Cholesterol: 180 mg/dL ? ?

## 2022-04-29 NOTE — Assessment & Plan Note (Signed)
Recommend routine dental and eye exams; pt notes over 2.5 years since he had an eye exam ?Things to do to keep yourself healthy  ?- Exercise at least 30-45 minutes a day, 3-4 days a week.  ?- Eat a low-fat diet with lots of fruits and vegetables, up to 7-9 servings per day.  ?- Seatbelts can save your life. Wear them always.  ?- Smoke detectors on every level of your home, check batteries every year.  ?- Eye Doctor - have an eye exam every 1-2 years  ?- Safe sex - if you may be exposed to STDs, use a condom.  ?- Alcohol -  If you drink, do it moderately, less than 2 drinks per day.  ?- Health Care Power of [place]. Choose someone to speak for you if you are not able.  ?- Depression is common in our stressful world.If you're feeling down or losing interest in things you normally enjoy, please come in for a visit.  ?- Violence - If anyone is threatening or hurting you, please call immediately. ? ? ?

## 2022-04-29 NOTE — Assessment & Plan Note (Signed)
Chronic, stable ?Uses 5 cans/week ?Encourage dental follow up for oral cancer screenings ?Not interested in quitting/reducing at this time ?

## 2022-04-29 NOTE — Assessment & Plan Note (Signed)
Low risk screen ?Consented; encouraged to "know your status" ?Recommend repeat screen if risk factors change ? ?

## 2022-04-30 LAB — COMPREHENSIVE METABOLIC PANEL
ALT: 16 IU/L (ref 0–44)
AST: 26 IU/L (ref 0–40)
Albumin/Globulin Ratio: 2 (ref 1.2–2.2)
Albumin: 4.6 g/dL (ref 3.8–4.9)
Alkaline Phosphatase: 91 IU/L (ref 44–121)
BUN/Creatinine Ratio: 25 — ABNORMAL HIGH (ref 9–20)
BUN: 20 mg/dL (ref 6–24)
Bilirubin Total: 0.3 mg/dL (ref 0.0–1.2)
CO2: 26 mmol/L (ref 20–29)
Calcium: 9.7 mg/dL (ref 8.7–10.2)
Chloride: 103 mmol/L (ref 96–106)
Creatinine, Ser: 0.8 mg/dL (ref 0.76–1.27)
Globulin, Total: 2.3 g/dL (ref 1.5–4.5)
Glucose: 88 mg/dL (ref 70–99)
Potassium: 4.8 mmol/L (ref 3.5–5.2)
Sodium: 142 mmol/L (ref 134–144)
Total Protein: 6.9 g/dL (ref 6.0–8.5)
eGFR: 102 mL/min/{1.73_m2} (ref >59)

## 2022-04-30 LAB — HEPATITIS C ANTIBODY: Hep C Virus Ab: NONREACTIVE

## 2022-04-30 LAB — CBC WITH DIFFERENTIAL/PLATELET
Basophils Absolute: 0 10*3/uL (ref 0.0–0.2)
Basos: 1 %
EOS (ABSOLUTE): 0.1 10*3/uL (ref 0.0–0.4)
Eos: 2 %
Hematocrit: 41.6 % (ref 37.5–51.0)
Hemoglobin: 14.3 g/dL (ref 13.0–17.7)
Immature Grans (Abs): 0 10*3/uL (ref 0.0–0.1)
Immature Granulocytes: 0 %
Lymphocytes Absolute: 1.4 10*3/uL (ref 0.7–3.1)
Lymphs: 18 %
MCH: 29.3 pg (ref 26.6–33.0)
MCHC: 34.4 g/dL (ref 31.5–35.7)
MCV: 85 fL (ref 79–97)
Monocytes Absolute: 0.8 10*3/uL (ref 0.1–0.9)
Monocytes: 11 %
Neutrophils Absolute: 5.3 10*3/uL (ref 1.4–7.0)
Neutrophils: 68 %
Platelets: 299 10*3/uL (ref 150–450)
RBC: 4.88 x10E6/uL (ref 4.14–5.80)
RDW: 13 % (ref 11.6–15.4)
WBC: 7.7 10*3/uL (ref 3.4–10.8)

## 2022-04-30 LAB — TSH+FREE T4
Free T4: 1.15 ng/dL (ref 0.82–1.77)
TSH: 1.21 u[IU]/mL (ref 0.450–4.500)

## 2022-04-30 LAB — LIPID PANEL
Chol/HDL Ratio: 4.1 ratio (ref 0.0–5.0)
Cholesterol, Total: 171 mg/dL (ref 100–199)
HDL: 42 mg/dL (ref >39)
LDL Chol Calc (NIH): 110 mg/dL — ABNORMAL HIGH (ref 0–99)
Triglycerides: 103 mg/dL (ref 0–149)
VLDL Cholesterol Cal: 19 mg/dL (ref 5–40)

## 2022-04-30 LAB — PSA: Prostate Specific Ag, Serum: 2.3 ng/mL (ref 0.0–4.0)

## 2022-04-30 LAB — HIV ANTIBODY (ROUTINE TESTING W REFLEX): HIV Screen 4th Generation wRfx: NONREACTIVE

## 2022-05-16 IMAGING — CR DG LUMBAR SPINE COMPLETE 4+V
1 series · 5 of 5 positions shown · non-contrast
Comparison: CT abdomen pelvis January 2015

CLINICAL DATA: Chronic midline and across lower back pain since
[REDACTED].

EXAM:
LUMBAR SPINE - COMPLETE 4+ VIEW; THORACIC SPINE - 3 VIEWS

[Series 1: dg lumbar spine complete 4 +v · 0.14mm/px · 5 of 5 slices shown]
[im 1/5]
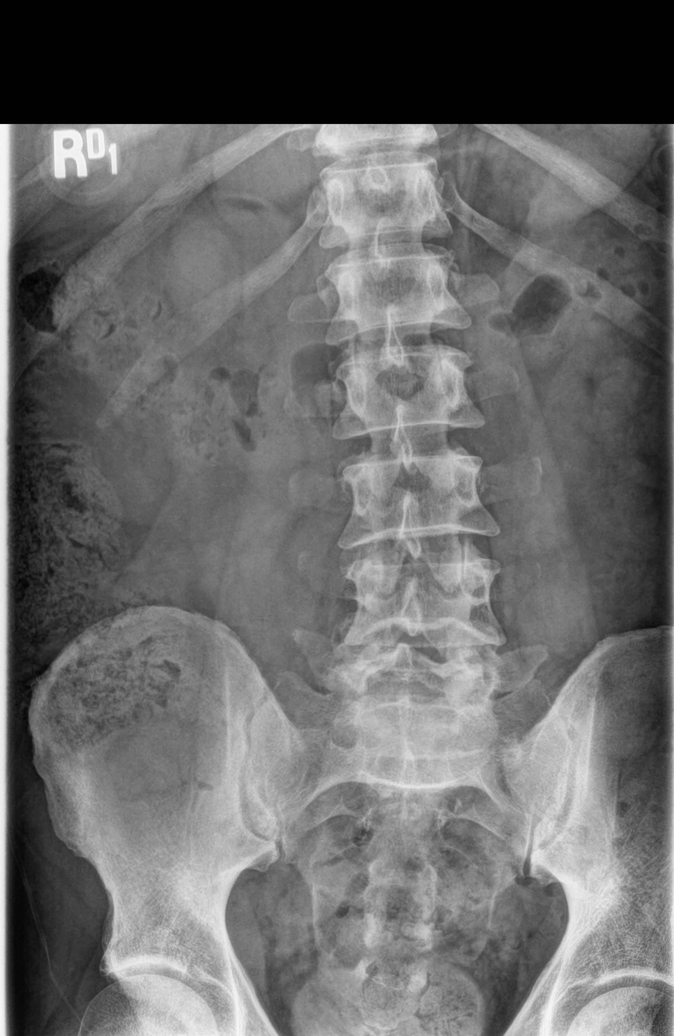
[im 2/5]
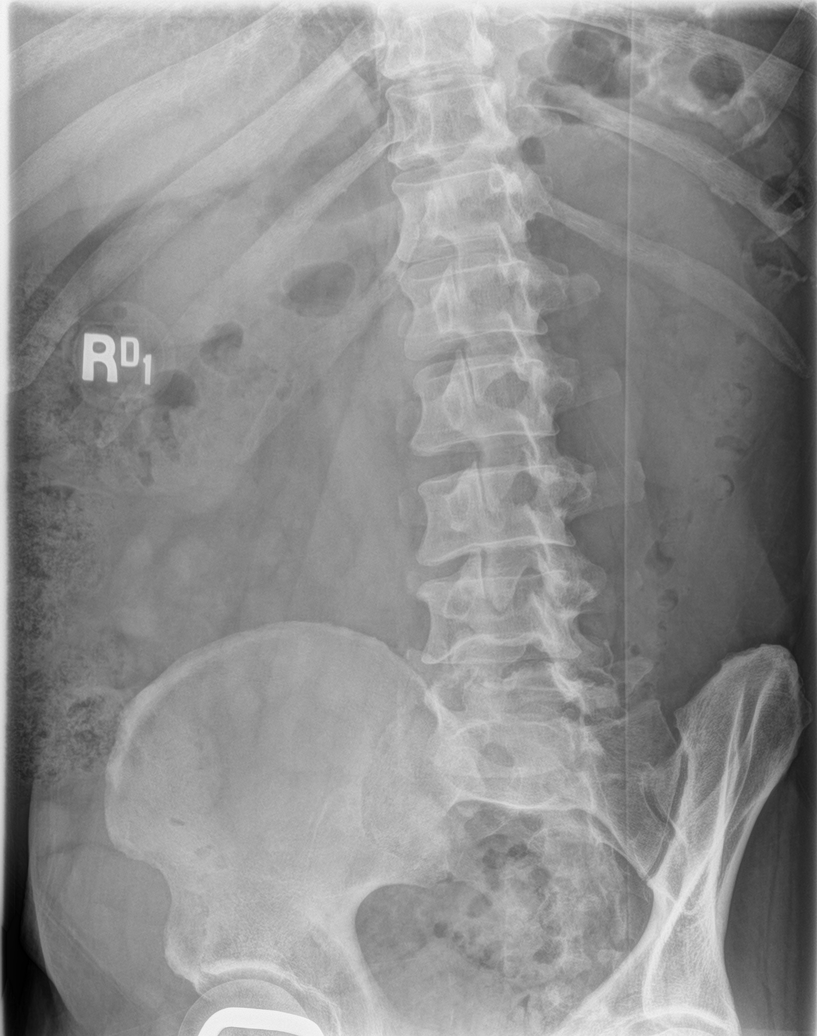
[im 3/5]
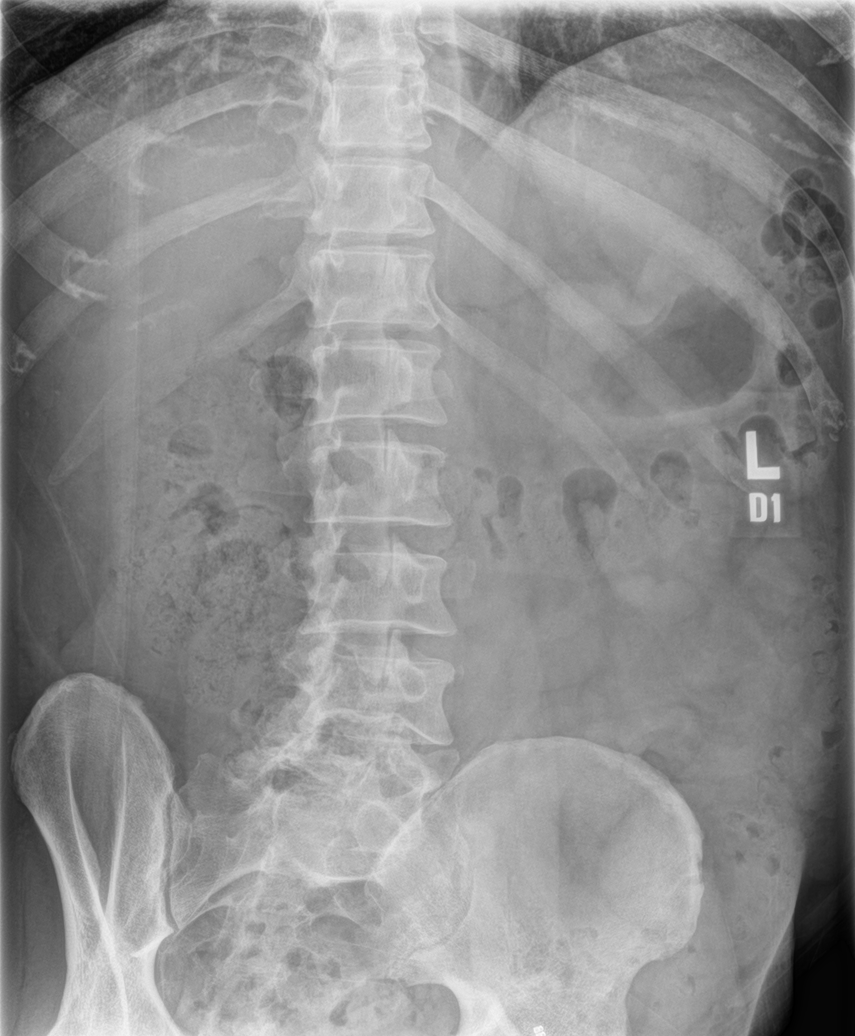
[im 4/5]
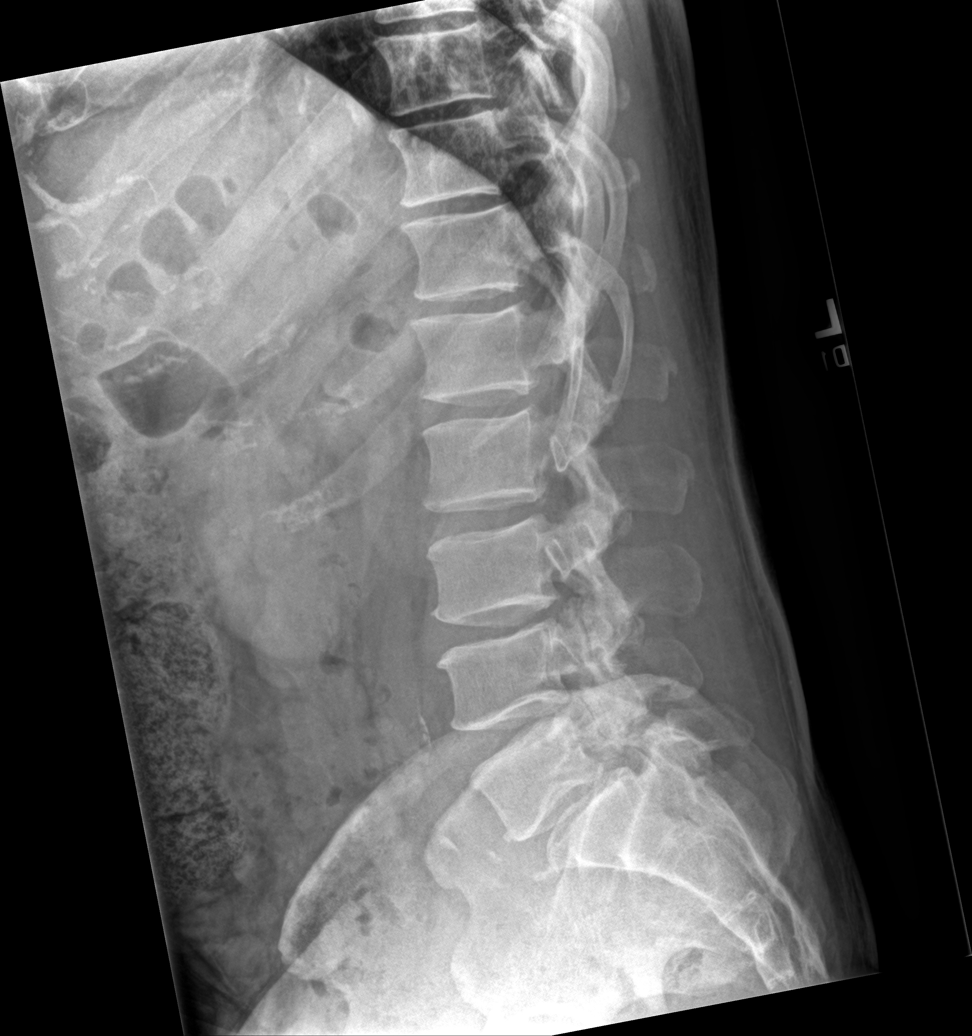
[im 5/5]
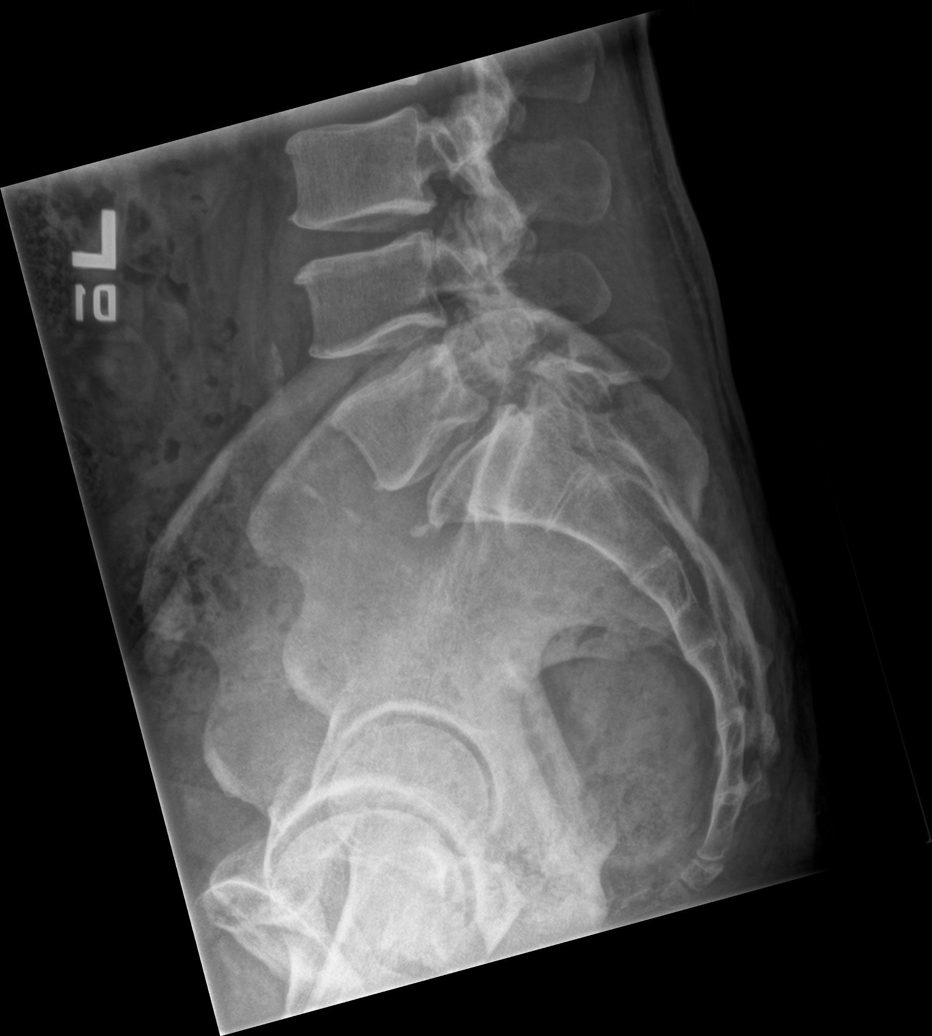

[5 of 5 positions shown; findings below may reference images not displayed]

FINDINGS: Thoracic spine:

There are 12 rib-bearing thoracic type vertebral bodies. Normal
alignment. The vertebral body heights are maintained without
compression fracture. The disc spaces are maintained without
significant degenerative changes. The soft tissues are unremarkable.

Lumbar spine:

There are 5 non-rib-bearing lumbar-type vertebral bodies. There is 6
mm of anterolisthesis of L5 on S1 with associated chronic L5
bilateral pars defects. The vertebral body heights are maintained
without compression fracture. Mild disc space disease at L5-S1. The
soft tissues are unremarkable.
IMPRESSION: Thoracic spine, lumbar spine:

Chronic L5 bilateral pars defects with associated 6 mm of
anterolisthesis and at least mild disc space disease at L5-S1.

## 2022-05-21 ENCOUNTER — Other Ambulatory Visit: Payer: Self-pay | Admitting: Family Medicine

## 2022-05-21 DIAGNOSIS — G8929 Other chronic pain: Secondary | ICD-10-CM

## 2022-09-06 ENCOUNTER — Other Ambulatory Visit: Payer: Self-pay | Admitting: Family Medicine

## 2022-09-06 DIAGNOSIS — G8929 Other chronic pain: Secondary | ICD-10-CM

## 2023-05-01 ENCOUNTER — Ambulatory Visit (INDEPENDENT_AMBULATORY_CARE_PROVIDER_SITE_OTHER): Payer: BC Managed Care – PPO | Admitting: Family Medicine

## 2023-05-01 ENCOUNTER — Encounter: Payer: Self-pay | Admitting: Family Medicine

## 2023-05-01 VITALS — BP 118/83 | HR 65 | Ht 71.0 in | Wt 182.0 lb

## 2023-05-01 DIAGNOSIS — F17220 Nicotine dependence, chewing tobacco, uncomplicated: Secondary | ICD-10-CM | POA: Diagnosis not present

## 2023-05-01 DIAGNOSIS — Z23 Encounter for immunization: Secondary | ICD-10-CM

## 2023-05-01 DIAGNOSIS — Z0000 Encounter for general adult medical examination without abnormal findings: Secondary | ICD-10-CM

## 2023-05-01 DIAGNOSIS — E7800 Pure hypercholesterolemia, unspecified: Secondary | ICD-10-CM

## 2023-05-01 DIAGNOSIS — Z125 Encounter for screening for malignant neoplasm of prostate: Secondary | ICD-10-CM | POA: Diagnosis not present

## 2023-05-01 NOTE — Assessment & Plan Note (Signed)
The 10-year ASCVD risk score (Arnett DK, et al., 2019) is: 7.4% Repeat LP Previously not on medication recommend diet low in saturated fat and regular exercise - 30 min at least 5 times per week Chronic, stable with low 110s LDL

## 2023-05-01 NOTE — Progress Notes (Signed)
Complete physical exam  Patient: Jack Holland   DOB: [birth year]   62 y.o. Male  MRN: [id number] Visit Date: 05/01/2023  Today's healthcare provider: Jacky Kindle, FNP  Re Introduced to nurse practitioner role and practice setting.  All questions answered.  Discussed provider/patient relationship and expectations.  Chief Complaint  Patient presents with   Annual Exam   Subjective    Jack Holland is a 62 y.o. male who presents today for a complete physical exam.  He reports consuming a general diet. The patient has a physically strenuous job, but has no regular exercise apart from work.  He generally feels well. He reports sleeping well. He does not have additional problems to discuss today.   HPI   Due for shingles vaccine; first dose given today today. The CDC recommends two doses of Shingrix (the new shingles vaccine) separated by 2 to 6 months for adults age 80 years and older. I recommend checking with your insurance plan regarding coverage for this vaccine.     Past Medical History:  Diagnosis Date   Allergy    Colon polyp    Headache    sinus   Psoriasis    Past Surgical History:  Procedure Laterality Date   COLONOSCOPY  2010?   COLONOSCOPY WITH PROPOFOL N/A 06/17/2019   Procedure: COLONOSCOPY WITH PROPOFOL;  Surgeon: Midge Minium, MD;  Location: [facility];  Service: Endoscopy;  Laterality: N/A;   UMBILICAL HERNIA REPAIR N/A 10/14/2019   Procedure: HERNIA REPAIR UMBILICAL ADULT;  Surgeon: Duanne Guess, MD;  Location: [facility];  Service: General;  Laterality: N/A;   Social History   Socioeconomic History   Marital status: Married    Spouse name: Not on file   Number of children: 2   Years of education: Not on file   Highest education level: Not on file  Occupational History   Not on file  Tobacco Use   Smoking status: Former   Smokeless tobacco: Current    Types: Chew  Vaping Use   Vaping Use: Never used  Substance and Sexual  Activity   Alcohol use: Yes    Comment: rarely   Drug use: No   Sexual activity: Not on file  Other Topics Concern   Not on file  Social History Narrative   Not on file   Social Determinants of Health   Financial Resource Strain: Not on file  Food Insecurity: Not on file  Transportation Needs: Not on file  Physical Activity: Not on file  Stress: Not on file  Social Connections: Not on file  Intimate Partner Violence: Not on file   Family Status  Relation Name Status   Mother  Alive   Father  Alive   Brother  Alive   Daughter  Alive   Son  Alive   Family History  Problem Relation Age of Onset   Diabetes Mother    Gout Father    Hypertension Father    Allergies  Allergen Reactions   Erythromycin Rash   Naproxen Hives    Patient Care Team: Jacky Kindle, FNP as PCP - General (Family Medicine)   Medications: Outpatient Medications Prior to Visit  Medication Sig   [DISCONTINUED] cetirizine (ZYRTEC) 10 MG tablet Take 10 mg by mouth daily as needed for allergies. (Patient not taking: Reported on 05/01/2023)   [DISCONTINUED] cyclobenzaprine (FLEXERIL) 5 MG tablet TAKE 1 TABLET BY MOUTH 3 TIMES DAILY AS NEEDED FOR MUSCLE SPASMS. START WITH EVENING DOSE ON NONE  WORKING DAY TO SEE HOW YOU DO WITH MEDICATION. UP TO 3/DAY (Patient not taking: Reported on 05/01/2023)   [DISCONTINUED] fluticasone (FLONASE) 50 MCG/ACT nasal spray Place 1-2 sprays into both nostrils daily as needed for allergies or rhinitis. (Patient not taking: Reported on 05/01/2023)   [DISCONTINUED] meloxicam (MOBIC) 15 MG tablet TAKE 1 TABLET BY MOUTH ONCE DAILY (Patient not taking: Reported on 05/01/2023)   No facility-administered medications prior to visit.    Review of Systems  Last CBC Lab Results  Component Value Date   WBC 7.7 04/29/2022   HGB 14.3 04/29/2022   HCT 41.6 04/29/2022   MCV 85 04/29/2022   MCH 29.3 04/29/2022   RDW 13.0 04/29/2022   PLT 299 04/29/2022   Last metabolic panel Lab  Results  Component Value Date   GLUCOSE 88 04/29/2022   NA 142 04/29/2022   K 4.8 04/29/2022   CL 103 04/29/2022   CO2 26 04/29/2022   BUN 20 04/29/2022   CREATININE 0.80 04/29/2022   EGFR 102 04/29/2022   CALCIUM 9.7 04/29/2022   PROT 6.9 04/29/2022   ALBUMIN 4.6 04/29/2022   LABGLOB 2.3 04/29/2022   AGRATIO 2.0 04/29/2022   BILITOT 0.3 04/29/2022   ALKPHOS 91 04/29/2022   AST 26 04/29/2022   ALT 16 04/29/2022   Last lipids Lab Results  Component Value Date   CHOL 171 04/29/2022   HDL 42 04/29/2022   LDLCALC 110 (H) 04/29/2022   TRIG 103 04/29/2022   CHOLHDL 4.1 04/29/2022   Last thyroid functions Lab Results  Component Value Date   TSH 1.210 04/29/2022      Objective    BP 118/83 (BP Location: Right Arm, Patient Position: Sitting, Cuff Size: Normal)   Pulse 65   Ht 5\' 11"  (1.803 m)   Wt 182 lb (82.6 kg)   SpO2 100%   BMI 25.38 kg/m   BP Readings from Last 3 Encounters:  05/01/23 118/83  04/29/22 121/85  03/25/22 122/78   Wt Readings from Last 3 Encounters:  05/01/23 182 lb (82.6 kg)  04/29/22 184 lb (83.5 kg)  03/25/22 188 lb 1.6 oz (85.3 kg)   SpO2 Readings from Last 3 Encounters:  05/01/23 100%  10/30/20 100%  10/20/20 99%   Physical Exam Vitals and nursing note reviewed.  Constitutional:      General: He is awake. He is not in acute distress.    Appearance: Normal appearance. He is well-developed, well-groomed and overweight. He is not ill-appearing, toxic-appearing or diaphoretic.  HENT:     Head: Normocephalic and atraumatic.     Jaw: There is normal jaw occlusion. No trismus, tenderness, swelling or pain on movement.     Salivary Glands: Right salivary gland is not diffusely enlarged or tender. Left salivary gland is not diffusely enlarged or tender.     Right Ear: Hearing, tympanic membrane, ear canal and external ear normal. There is no impacted cerumen.     Left Ear: Hearing, tympanic membrane, ear canal and external ear normal. There  is no impacted cerumen.     Nose: Nose normal. No congestion or rhinorrhea.     Right Turbinates: Not enlarged, swollen or pale.     Left Turbinates: Not enlarged, swollen or pale.     Right Sinus: No maxillary sinus tenderness or frontal sinus tenderness.     Left Sinus: No maxillary sinus tenderness or frontal sinus tenderness.     Mouth/Throat:     Lips: Pink.     Mouth: Mucous  membranes are moist. No injury, lacerations, oral lesions or angioedema.     Pharynx: Oropharynx is clear. Uvula midline. No pharyngeal swelling, oropharyngeal exudate or posterior oropharyngeal erythema.     Tonsils: No tonsillar exudate or tonsillar abscesses.  Eyes:     General: Lids are normal. Vision grossly intact. Gaze aligned appropriately.        Right eye: No discharge.        Left eye: No discharge.     Extraocular Movements: Extraocular movements intact.     Conjunctiva/sclera: Conjunctivae normal.     Pupils: Pupils are equal, round, and reactive to light.  Neck:     Thyroid: No thyroid mass, thyromegaly or thyroid tenderness.     Vascular: No carotid bruit.     Trachea: Trachea normal. No tracheal tenderness.  Cardiovascular:     Rate and Rhythm: Normal rate and regular rhythm.     Pulses: Normal pulses.          Carotid pulses are 2+ on the right side and 2+ on the left side.      Radial pulses are 2+ on the right side and 2+ on the left side.       Femoral pulses are 2+ on the right side and 2+ on the left side.      Popliteal pulses are 2+ on the right side and 2+ on the left side.       Dorsalis pedis pulses are 2+ on the right side and 2+ on the left side.       Posterior tibial pulses are 2+ on the right side and 2+ on the left side.     Heart sounds: Normal heart sounds, S1 normal and S2 normal. No murmur heard.    No friction rub. No gallop.  Pulmonary:     Effort: Pulmonary effort is normal. No respiratory distress.     Breath sounds: Normal breath sounds and air entry. No stridor.  No wheezing, rhonchi or rales.  Chest:     Chest wall: No tenderness.  Abdominal:     General: Abdomen is flat. Bowel sounds are normal. There is no distension.     Palpations: Abdomen is soft. There is no mass.     Tenderness: There is no abdominal tenderness. There is no guarding or rebound.     Hernia: No hernia is present.  Genitourinary:    Comments: Exam deferred; denies complaints Musculoskeletal:        General: No swelling, tenderness, deformity or signs of injury. Normal range of motion.     Cervical back: Normal range of motion and neck supple. No rigidity or tenderness.     Right lower leg: No edema.     Left lower leg: No edema.  Lymphadenopathy:     Cervical: No cervical adenopathy.     Right cervical: No superficial, deep or posterior cervical adenopathy.    Left cervical: No superficial, deep or posterior cervical adenopathy.  Skin:    General: Skin is warm and dry.     Capillary Refill: Capillary refill takes less than 2 seconds.     Coloration: Skin is not jaundiced or pale.     Findings: No bruising, erythema, lesion or rash.  Neurological:     General: No focal deficit present.     Mental Status: He is alert and oriented to person, place, and time. Mental status is at baseline.     GCS: GCS eye subscore is 4. GCS verbal subscore is  5. GCS motor subscore is 6.     Sensory: Sensation is intact. No sensory deficit.     Motor: Motor function is intact. No weakness.     Coordination: Coordination is intact.     Gait: Gait is intact.  Psychiatric:        Attention and Perception: Attention and perception normal.        Mood and Affect: Mood and affect normal.        Speech: Speech normal.        Behavior: Behavior normal. Behavior is cooperative.        Thought Content: Thought content normal.        Cognition and Memory: Cognition normal.        Judgment: Judgment normal.     Last depression screening scores    05/01/2023    8:50 AM 04/29/2022    3:09 PM  03/25/2022    3:56 PM  PHQ 2/9 Scores  PHQ - 2 Score 0 0 0  PHQ- 9 Score 2 2 0   Last fall risk screening    05/01/2023    8:50 AM  Fall Risk   Falls in the past year? 0  Number falls in past yr: 0  Injury with Fall? 0   Last Audit-C alcohol use screening    05/01/2023    8:50 AM  Alcohol Use Disorder Test (AUDIT)  1. How often do you have a drink containing alcohol? 2  2. How many drinks containing alcohol do you have on a typical day when you are drinking? 0  3. How often do you have six or more drinks on one occasion? 0  AUDIT-C Score 2   A score of 3 or more in women, and 4 or more in men indicates increased risk for alcohol abuse, EXCEPT if all of the points are from question 1   No results found for any visits on 05/01/23.  Assessment & Plan    Routine Health Maintenance and Physical Exam  Exercise Activities and Dietary recommendations  Goals   None     Immunization History  Administered Date(s) Administered   Influenza,inj,Quad PF,6+ Mos 12/14/2017, 10/03/2020   Td 04/29/2022   Tdap 11/03/2011    Health Maintenance  Topic Date Due   COVID-19 Vaccine (1) Never done   Zoster Vaccines- Shingrix (1 of 2) Never done   INFLUENZA VACCINE  07/20/2023   COLONOSCOPY (Pts 45-5yrs Insurance coverage will need to be confirmed)  06/16/2029   DTaP/Tdap/Td (3 - Td or Tdap) 04/29/2032   Hepatitis C Screening  Completed   HIV Screening  Completed   HPV VACCINES  Aged Out    Discussed health benefits of physical activity, and encouraged him to engage in regular exercise appropriate for his age and condition.  Problem List Items Addressed This Visit       Other   Annual physical exam - Primary    UTD on dental and vision No concerns at this time Wishes to get shingrix  Things to do to keep yourself healthy  - Exercise at least 30-45 minutes a day, 3-4 days a week.  - Eat a low-fat diet with lots of fruits and vegetables, up to 7-9 servings per day.  - Seatbelts  can save your life. Wear them always.  - Smoke detectors on every level of your home, check batteries every year.  - Eye Doctor - have an eye exam every 1-2 years  - Safe sex - if you  may be exposed to STDs, use a condom.  - Alcohol -  If you drink, do it moderately, less than 2 drinks per day.  - Health Care Power of Attorney. Choose someone to speak for you if you are not able.  - Depression is common in our stressful world.If you're feeling down or losing interest in things you normally enjoy, please come in for a visit.  - Violence - If anyone is threatening or hurting you, please call immediately.       Relevant Orders   Comprehensive Metabolic Panel (CMET)   CBC with Differential/Platelet   TSH + free T4   Lipid panel   PSA   Elevated LDL cholesterol level    The 10-year ASCVD risk score (Arnett DK, et al., 2019) is: 7.4% Repeat LP Previously not on medication recommend diet low in saturated fat and regular exercise - 30 min at least 5 times per week Chronic, stable with low 110s LDL      Relevant Orders   Lipid panel   Need for shingles vaccine    1st dose provided      Relevant Orders   Zoster Recombinant (Shingrix )   Screening PSA (prostate specific antigen)    Denies LUTS; recommend PSA in place of DRE. If PSA is elevated for age, we will repeat; if PSA remains elevated pt will be referred to urology for DRE and next steps for best treatment.       Relevant Orders   PSA   Tobacco dependence due to chewing tobacco    Chronic, unchanged- reports 5 cans/week Declines assistance on cessation UTD on seeing dental for oral cancer screening        Return in about 8 weeks (around 06/26/2023) for immunization, nurse follow up- second shingrix 2-6 month f/u.    Leilani Merl, FNP, have reviewed all documentation for this visit. The documentation on 05/01/23 for the exam, diagnosis, procedures, and orders are all accurate and complete.  Jacky Kindle, FNP  [facility] [phone] (phone) [phone] (fax)  [facility]

## 2023-05-01 NOTE — Assessment & Plan Note (Signed)
UTD on dental and vision No concerns at this time Wishes to get shingrix  Things to do to keep yourself healthy  - Exercise at least 30-45 minutes a day, 3-4 days a week.  - Eat a low-fat diet with lots of fruits and vegetables, up to 7-9 servings per day.  - Seatbelts can save your life. Wear them always.  - Smoke detectors on every level of your home, check batteries every year.  - Eye Doctor - have an eye exam every 1-2 years  - Safe sex - if you may be exposed to STDs, use a condom.  - Alcohol -  If you drink, do it moderately, less than 2 drinks per day.  - Health Care Power of Attorney. Choose someone to speak for you if you are not able.  - Depression is common in our stressful world.If you're feeling down or losing interest in things you normally enjoy, please come in for a visit.  - Violence - If anyone is threatening or hurting you, please call immediately.

## 2023-05-01 NOTE — Patient Instructions (Signed)
The CDC recommends two doses of Shingrix (the new shingles vaccine) separated by 2 to 6 months for adults age 61 years and older. I recommend checking with your insurance plan regarding coverage for this vaccine.    

## 2023-05-01 NOTE — Assessment & Plan Note (Signed)
1st dose provided °

## 2023-05-01 NOTE — Assessment & Plan Note (Signed)
Chronic, unchanged- reports 5 cans/week Declines assistance on cessation UTD on seeing dental for oral cancer screening

## 2023-05-01 NOTE — Assessment & Plan Note (Signed)
Denies LUTS; recommend PSA in place of DRE. If PSA is elevated for age, we will repeat; if PSA remains elevated pt will be referred to urology for DRE and next steps for best treatment.  

## 2023-05-02 LAB — COMPREHENSIVE METABOLIC PANEL
ALT: 12 IU/L (ref 0–44)
AST: 19 IU/L (ref 0–40)
Albumin/Globulin Ratio: 1.8 (ref 1.2–2.2)
Albumin: 4.2 g/dL (ref 3.8–4.9)
Alkaline Phosphatase: 80 IU/L (ref 44–121)
BUN/Creatinine Ratio: 23 (ref 10–24)
BUN: 20 mg/dL (ref 8–27)
Bilirubin Total: 0.3 mg/dL (ref 0.0–1.2)
CO2: 23 mmol/L (ref 20–29)
Calcium: 9.5 mg/dL (ref 8.6–10.2)
Chloride: 102 mmol/L (ref 96–106)
Creatinine, Ser: 0.87 mg/dL (ref 0.76–1.27)
Globulin, Total: 2.4 g/dL (ref 1.5–4.5)
Glucose: 98 mg/dL (ref 70–99)
Potassium: 4.3 mmol/L (ref 3.5–5.2)
Sodium: 139 mmol/L (ref 134–144)
Total Protein: 6.6 g/dL (ref 6.0–8.5)
eGFR: 99 mL/min/{1.73_m2} (ref >59)

## 2023-05-02 LAB — CBC WITH DIFFERENTIAL/PLATELET
Basophils Absolute: 0.1 10*3/uL (ref 0.0–0.2)
Basos: 1 %
EOS (ABSOLUTE): 0.2 10*3/uL (ref 0.0–0.4)
Eos: 4 %
Hematocrit: 44.2 % (ref 37.5–51.0)
Hemoglobin: 14.7 g/dL (ref 13.0–17.7)
Immature Grans (Abs): 0 10*3/uL (ref 0.0–0.1)
Immature Granulocytes: 0 %
Lymphocytes Absolute: 1.2 10*3/uL (ref 0.7–3.1)
Lymphs: 24 %
MCH: 29.2 pg (ref 26.6–33.0)
MCHC: 33.3 g/dL (ref 31.5–35.7)
MCV: 88 fL (ref 79–97)
Monocytes Absolute: 0.5 10*3/uL (ref 0.1–0.9)
Monocytes: 10 %
Neutrophils Absolute: 3.1 10*3/uL (ref 1.4–7.0)
Neutrophils: 61 %
Platelets: 291 10*3/uL (ref 150–450)
RBC: 5.03 x10E6/uL (ref 4.14–5.80)
RDW: 13.1 % (ref 11.6–15.4)
WBC: 5.2 10*3/uL (ref 3.4–10.8)

## 2023-05-02 LAB — TSH+FREE T4
Free T4: 1.14 ng/dL (ref 0.82–1.77)
TSH: 1.58 u[IU]/mL (ref 0.450–4.500)

## 2023-05-02 LAB — LIPID PANEL
Chol/HDL Ratio: 4 ratio (ref 0.0–5.0)
Cholesterol, Total: 171 mg/dL (ref 100–199)
HDL: 43 mg/dL (ref >39)
LDL Chol Calc (NIH): 108 mg/dL — ABNORMAL HIGH (ref 0–99)
Triglycerides: 108 mg/dL (ref 0–149)
VLDL Cholesterol Cal: 20 mg/dL (ref 5–40)

## 2023-05-02 LAB — PSA: Prostate Specific Ag, Serum: 2.6 ng/mL (ref 0.0–4.0)

## 2023-05-02 NOTE — Progress Notes (Signed)
Stable elevated LDL/bad cholesterol. The 10-year ASCVD risk score (Arnett DK, et al., 2019) is: 7.3%. Moderate risk remains for heart attack and/or stroke. I continue to recommend diet low in saturated fat and regular exercise - 30 min at least 5 times per week. If desired can start lipitor 20 mg to assist.  All other labs normal/stable.

## 2023-06-28 ENCOUNTER — Ambulatory Visit (INDEPENDENT_AMBULATORY_CARE_PROVIDER_SITE_OTHER): Payer: BC Managed Care – PPO | Admitting: Family Medicine

## 2023-06-28 DIAGNOSIS — Z23 Encounter for immunization: Secondary | ICD-10-CM

## 2023-06-28 NOTE — Progress Notes (Signed)
Patient seen by CMA for vaccination; no immediate side effects or complications.   Jacky Kindle, FNP  [facility] [address] [place], [place] [address] [phone] (phone) [phone] (fax) [facility]

## 2023-12-29 DIAGNOSIS — Z125 Encounter for screening for malignant neoplasm of prostate: Secondary | ICD-10-CM | POA: Diagnosis not present

## 2023-12-29 DIAGNOSIS — Z0000 Encounter for general adult medical examination without abnormal findings: Secondary | ICD-10-CM | POA: Diagnosis not present

## 2023-12-29 DIAGNOSIS — Z23 Encounter for immunization: Secondary | ICD-10-CM | POA: Diagnosis not present

## 2023-12-29 DIAGNOSIS — Z0001 Encounter for general adult medical examination with abnormal findings: Secondary | ICD-10-CM | POA: Diagnosis not present

## 2023-12-29 DIAGNOSIS — L739 Follicular disorder, unspecified: Secondary | ICD-10-CM | POA: Diagnosis not present

## 2024-04-17 DIAGNOSIS — R1084 Generalized abdominal pain: Secondary | ICD-10-CM | POA: Diagnosis not present

## 2024-05-01 ENCOUNTER — Encounter: Payer: BC Managed Care – PPO | Admitting: Family Medicine
